# Patient Record
Sex: Female | Born: 1980 | Race: Black or African American | Hispanic: No | Marital: Single | Smoking: Former smoker
Health system: Southern US, Community
[De-identification: ages and names within clinical notes are randomized; demographics above are authoritative.]

## PROBLEM LIST (undated history)

## (undated) DIAGNOSIS — E119 Type 2 diabetes mellitus without complications: Secondary | ICD-10-CM

## (undated) DIAGNOSIS — R7303 Prediabetes: Secondary | ICD-10-CM

## (undated) DIAGNOSIS — T7840XA Allergy, unspecified, initial encounter: Secondary | ICD-10-CM

## (undated) DIAGNOSIS — R519 Headache, unspecified: Secondary | ICD-10-CM

## (undated) DIAGNOSIS — I10 Essential (primary) hypertension: Secondary | ICD-10-CM

## (undated) DIAGNOSIS — F419 Anxiety disorder, unspecified: Secondary | ICD-10-CM

## (undated) DIAGNOSIS — M1990 Unspecified osteoarthritis, unspecified site: Secondary | ICD-10-CM

## (undated) DIAGNOSIS — G8929 Other chronic pain: Secondary | ICD-10-CM

## (undated) DIAGNOSIS — F32A Depression, unspecified: Secondary | ICD-10-CM

## (undated) HISTORY — DX: Unspecified osteoarthritis, unspecified site: M19.90

## (undated) HISTORY — DX: Depression, unspecified: F32.A

## (undated) HISTORY — PX: OTHER SURGICAL HISTORY: SHX169

## (undated) HISTORY — DX: Prediabetes: R73.03

## (undated) HISTORY — DX: Other chronic pain: G89.29

## (undated) HISTORY — DX: Allergy, unspecified, initial encounter: T78.40XA

## (undated) HISTORY — PX: TUBAL LIGATION: SHX77

## (undated) HISTORY — DX: Anxiety disorder, unspecified: F41.9

---
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
## 2002-04-15 ENCOUNTER — Inpatient Hospital Stay (HOSPITAL_COMMUNITY): Admission: AD | Admit: 2002-04-15 | Discharge: 2002-04-17 | Payer: Self-pay | Admitting: Obstetrics and Gynecology

## 2003-07-30 ENCOUNTER — Emergency Department (HOSPITAL_COMMUNITY): Admission: EM | Admit: 2003-07-30 | Discharge: 2003-07-30 | Payer: Self-pay | Admitting: Family Medicine

## 2003-08-09 ENCOUNTER — Emergency Department (HOSPITAL_COMMUNITY): Admission: EM | Admit: 2003-08-09 | Discharge: 2003-08-09 | Payer: Self-pay

## 2004-12-13 ENCOUNTER — Ambulatory Visit (HOSPITAL_COMMUNITY): Admission: RE | Admit: 2004-12-13 | Discharge: 2004-12-13 | Payer: Self-pay | Admitting: Obstetrics & Gynecology

## 2005-12-27 ENCOUNTER — Emergency Department (HOSPITAL_COMMUNITY): Admission: EM | Admit: 2005-12-27 | Discharge: 2005-12-27 | Payer: Self-pay | Admitting: Emergency Medicine

## 2006-06-29 ENCOUNTER — Emergency Department (HOSPITAL_COMMUNITY): Admission: EM | Admit: 2006-06-29 | Discharge: 2006-06-29 | Payer: Self-pay | Admitting: Family Medicine

## 2006-07-01 ENCOUNTER — Emergency Department (HOSPITAL_COMMUNITY): Admission: EM | Admit: 2006-07-01 | Discharge: 2006-07-01 | Payer: Self-pay | Admitting: Emergency Medicine

## 2007-05-22 ENCOUNTER — Emergency Department (HOSPITAL_COMMUNITY): Admission: EM | Admit: 2007-05-22 | Discharge: 2007-05-22 | Payer: Self-pay | Admitting: Emergency Medicine

## 2007-11-14 ENCOUNTER — Emergency Department (HOSPITAL_COMMUNITY): Admission: EM | Admit: 2007-11-14 | Discharge: 2007-11-14 | Payer: Self-pay | Admitting: Emergency Medicine

## 2010-05-25 NOTE — Op Note (Signed)
NAME:  Nicole Deleon, REGISTER NO.:  [id number]   MEDICAL RECORD NO.:  [id number]          PATIENT TYPE:  AMB   LOCATION:  SDC                           FACILITY:  [facility]   PHYSICIAN:  Roseanna Rainbow, M.D.DATE OF BIRTH:  1980/02/18   DATE OF PROCEDURE:  12/13/2004  DATE OF DISCHARGE:                                 OPERATIVE REPORT   PREOPERATIVE DIAGNOSIS:  Multiparity, desires a sterilization procedure.   POSTOPERATIVE DIAGNOSIS:  Multiparity, desires a sterilization procedure.   PROCEDURE:  Laparoscopic bilateral tubal ligation with bipolar cautery.   SURGEON:  Roseanna Rainbow, M.D.   ANESTHESIA:  General endotracheal.   SPECIMENS:  None.   COMPLICATIONS:  None.   PROCEDURES:  The patient was taken to the operating room.  A general  anesthetic was administered without difficulty.  She was then placed in the  dorsal lithotomy position and prepped and draped in the usual sterile  fashion.  A bivalve speculum was placed in the patient's vagina.  The  anterior lip of the cervix was grasped with a single-tooth tenaculum.  A  Hulka manipulator was then advanced into the uterus and secured to the  anterior lip of the cervix as a means to manipulate the uterus.  The single-  tooth tenaculum and speculum were removed from the vagina.  Attention was  then turned to the abdomen, where a skin incision was made in the umbilical  fold.  While tenting up the anterior abdominal wall, the Veress needle was  then introduced into the abdomen at a 45 degree angle.  Intra-abdominal  placement was confirmed by a saline drop test and an appropriate low  pressure reading upon insufflation of CO2 gas.  The abdomen was the  insufflated with 4 L of CO2 gas.  The Veress needle was removed.  The trocar  and sleeve were then advanced into the abdomen, where intra-abdominal  placement was confirmed with the laparoscope.  A survey of the pelvic  viscera was normal.  The midisthmic  portion of the right fallopian tube was  then cauterized contiguously.  With each application, the Ohm meter was  noted to go to 0.  A 2 cm segment of tube was cauterized.  The left  fallopian tube was then manipulated in a similar fashion.  All the  instruments were removed from the abdomen.  The skin incision was  reapproximated with 3-0 Vicryl and Dermabond.  The Hulka manipulator was  removed from the vagina with minimal bleeding noted from the cervix.  At the  close of the procedure the instrument and pack counts were said to be  correct x2.  The patient was taken to the PACU awake and in stable  condition.      Roseanna Rainbow, M.D.  Electronically Signed    LAJ/MEDQ  D:  12/13/2004  T:  12/13/2004  Job:  [id number]

## 2010-10-09 LAB — COMPREHENSIVE METABOLIC PANEL
ALT: 15
AST: 18
Albumin: 3.6
Alkaline Phosphatase: 80
BUN: 7
CO2: 25
Calcium: 9.1
Chloride: 110
Creatinine, Ser: 0.67
GFR calc Af Amer: >60
GFR calc non Af Amer: >60
Glucose, Bld: 83
Potassium: 3.6
Sodium: 141
Total Bilirubin: 0.4
Total Protein: 6.8

## 2010-10-23 ENCOUNTER — Emergency Department (HOSPITAL_COMMUNITY)
Admission: EM | Admit: 2010-10-23 | Discharge: 2010-10-23 | Disposition: A | Discharge status: Home / Self Care | Payer: Self-pay | Attending: Emergency Medicine | Admitting: Emergency Medicine

## 2010-10-23 DIAGNOSIS — I10 Essential (primary) hypertension: Secondary | ICD-10-CM | POA: Insufficient documentation

## 2010-10-23 DIAGNOSIS — M79609 Pain in unspecified limb: Secondary | ICD-10-CM | POA: Insufficient documentation

## 2011-01-29 ENCOUNTER — Emergency Department (HOSPITAL_COMMUNITY)
Admission: EM | Admit: 2011-01-29 | Discharge: 2011-01-29 | Disposition: A | Discharge status: Home / Self Care | Payer: No Typology Code available for payment source | Attending: Emergency Medicine | Admitting: Emergency Medicine

## 2011-01-29 ENCOUNTER — Encounter (HOSPITAL_COMMUNITY): Payer: Self-pay | Admitting: *Deleted

## 2011-01-29 ENCOUNTER — Emergency Department (HOSPITAL_COMMUNITY): Payer: No Typology Code available for payment source

## 2011-01-29 DIAGNOSIS — M25561 Pain in right knee: Secondary | ICD-10-CM

## 2011-01-29 DIAGNOSIS — S239XXA Sprain of unspecified parts of thorax, initial encounter: Secondary | ICD-10-CM | POA: Insufficient documentation

## 2011-01-29 DIAGNOSIS — R269 Unspecified abnormalities of gait and mobility: Secondary | ICD-10-CM | POA: Insufficient documentation

## 2011-01-29 DIAGNOSIS — M546 Pain in thoracic spine: Secondary | ICD-10-CM | POA: Insufficient documentation

## 2011-01-29 DIAGNOSIS — M25569 Pain in unspecified knee: Secondary | ICD-10-CM | POA: Insufficient documentation

## 2011-01-29 DIAGNOSIS — IMO0002 Reserved for concepts with insufficient information to code with codable children: Secondary | ICD-10-CM

## 2011-01-29 MED ORDER — DIAZEPAM 5 MG PO TABS: 5.0000 mg | ORAL_TABLET | Freq: Three times a day (TID) | ORAL | Status: AC | PRN

## 2011-01-29 MED ORDER — IBUPROFEN 800 MG PO TABS
800.0000 mg | ORAL_TABLET | Freq: Once | ORAL | Status: AC
  Administered 2011-01-29: 800 mg via ORAL
  Filled 2011-01-29: qty 1

## 2011-01-29 MED ORDER — IBUPROFEN 600 MG PO TABS: 600.0000 mg | ORAL_TABLET | Freq: Four times a day (QID) | ORAL | Status: AC | PRN

## 2011-01-29 MED ORDER — DIAZEPAM 5 MG PO TABS
5.0000 mg | ORAL_TABLET | Freq: Once | ORAL | Status: AC
  Administered 2011-01-29: 5 mg via ORAL
  Filled 2011-01-29: qty 1

## 2011-01-29 NOTE — ED Notes (Signed)
Ortho paged & notified regarding knee sleeve
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
## 2011-01-29 NOTE — ED Provider Notes (Signed)
Medical screening examination/treatment/procedure(s) were performed by non-physician practitioner and as supervising physician I was immediately available for consultation/collaboration.   Gerhard Munch, MD 01/29/11 [phone]
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
## 2011-10-18 ENCOUNTER — Emergency Department (HOSPITAL_COMMUNITY)
Admission: EM | Admit: 2011-10-18 | Discharge: 2011-10-18 | Disposition: A | Discharge status: Home / Self Care | Payer: Self-pay | Attending: Emergency Medicine | Admitting: Emergency Medicine

## 2011-10-18 ENCOUNTER — Encounter (HOSPITAL_COMMUNITY): Payer: Self-pay | Admitting: *Deleted

## 2011-10-18 DIAGNOSIS — E119 Type 2 diabetes mellitus without complications: Secondary | ICD-10-CM | POA: Insufficient documentation

## 2011-10-18 DIAGNOSIS — F17200 Nicotine dependence, unspecified, uncomplicated: Secondary | ICD-10-CM | POA: Insufficient documentation

## 2011-10-18 DIAGNOSIS — I10 Essential (primary) hypertension: Secondary | ICD-10-CM | POA: Insufficient documentation

## 2011-10-18 DIAGNOSIS — M65849 Other synovitis and tenosynovitis, unspecified hand: Secondary | ICD-10-CM | POA: Insufficient documentation

## 2011-10-18 DIAGNOSIS — M65839 Other synovitis and tenosynovitis, unspecified forearm: Secondary | ICD-10-CM | POA: Insufficient documentation

## 2011-10-18 DIAGNOSIS — M659 Synovitis and tenosynovitis, unspecified: Secondary | ICD-10-CM

## 2011-10-18 HISTORY — DX: Type 2 diabetes mellitus without complications: E11.9

## 2011-10-18 HISTORY — DX: Essential (primary) hypertension: I10

## 2011-10-18 MED ORDER — NAPROXEN 500 MG PO TABS: 500.0000 mg | ORAL_TABLET | Freq: Two times a day (BID) | ORAL | Status: DC

## 2011-10-18 MED ORDER — IBUPROFEN 800 MG PO TABS: 800.0000 mg | ORAL_TABLET | Freq: Once | ORAL | Status: DC

## 2011-10-18 NOTE — ED Provider Notes (Signed)
History     CSN: [id number]  Arrival date & time 10/18/11  1615   First MD Initiated Contact with Patient 10/18/11 1642      Chief Complaint  Patient presents with  . Hand Injury    (Consider location/radiation/quality/duration/timing/severity/associated sxs/prior treatment) HPI Comments: Pt to ER with right hand pain. Onset > then 2 weeks ago. Pain located over radial styloid area. Pain severity mild to moderate, exacerbated by thumb movements, improved by nothing. Pt denies F/NS/Chills, numbness, tingling or weakness of extremity.    Past Medical History  Diagnosis Date  . Diabetes mellitus without complication   . Hypertension     Past Surgical History  Procedure Date  . No previous surgeries     No family history on file.  History  Substance Use Topics  . Smoking status: Current Every Day Smoker -- 0.5 packs/day for 12 years    Types: Cigarettes  . Smokeless tobacco: Not on file  . Alcohol Use: Yes    OB History    Grav Para Term Preterm Abortions TAB SAB Ect Mult Living                  Review of Systems  Constitutional: Negative for fever, diaphoresis and activity change.  HENT: Negative for congestion and neck pain.   Respiratory: Negative for cough.   Genitourinary: Negative for dysuria.  Musculoskeletal: Negative for myalgias.  Skin: Negative for color change and wound.  Neurological: Negative for headaches.  All other systems reviewed and are negative.    Allergies  Review of patient's allergies indicates no known allergies.  Home Medications   Current Outpatient Rx  Name Route Sig Dispense Refill  . NYQUIL PO Oral Take 30 mLs by mouth as needed. FOR COLD SYMPTOMS.      BP 126/83  Pulse 96  Temp 98.1 F (36.7 C) (Oral)  Resp 16  Ht 5\' 11"  (1.803 m)  Wt 280 lb (127.007 kg)  BMI 39.05 kg/m2  SpO2 100%  LMP 10/12/2011  Physical Exam  Nursing note and vitals reviewed. Constitutional: She appears well-developed and well-nourished.  No distress.  HENT:  Head: Normocephalic and atraumatic.  Eyes: Conjunctivae normal and EOM are normal.  Neck: Normal range of motion. Neck supple.  Cardiovascular:       Intact distal pulses, capillary refill < 3 seconds  Musculoskeletal:       Finkelstein test positive. TTP over radial styloid area. All other extremities with normal ROM  Neurological:       No sensory deficit  Skin: She is not diaphoretic.       Skin intact, no tenting    ED Course  Procedures (including critical care time)  Labs Reviewed - No data to display No results found.   No diagnosis found.    MDM   PE c/w Tenosynovitis, imaging not indicated at this time.  Pt advised to follow up with orthopedics (hand) if symptoms persist after conservative home therapy including Velcro splint, NSAIDs, and ICE.  Patient given brace while in ED, conservative therapy recommended and discussed. Patient will be dc home & is agreeable with above plan.           Jaci Carrel, [place] 10/18/11 1714

## 2011-10-18 NOTE — ED Notes (Signed)
Pt arrives ambulatory to [facility] with c/o pain to right hand. Pulses palpable, can move all digits. Pt sts that her hand just hurts when she does move it.

## 2011-10-18 NOTE — Progress Notes (Signed)
Pt confirms she is [place] a pcp.  Stating she has applied for medicaid but has not heard anything.  CM spoke with pt to review list of self pay pcps to further assist her with prescriptions and health care.  Discussed discounted pharmacies, DSS, health dept, needymeds.org and financial assistance programs in [employer]  Pt voiced understanding and appreciation of resources and services offered

## 2011-10-18 NOTE — ED Provider Notes (Signed)
Medical screening examination/treatment/procedure(s) were performed by non-physician practitioner and as supervising physician I was immediately available for consultation/collaboration.  Jones Skene, M.D.     Jones Skene, MD 10/18/11 1817

## 2012-01-05 ENCOUNTER — Emergency Department (HOSPITAL_COMMUNITY)
Admission: EM | Admit: 2012-01-05 | Discharge: 2012-01-05 | Disposition: A | Discharge status: Home / Self Care | Payer: Self-pay | Attending: Emergency Medicine | Admitting: Emergency Medicine

## 2012-01-05 ENCOUNTER — Encounter (HOSPITAL_COMMUNITY): Payer: Self-pay | Admitting: *Deleted

## 2012-01-05 DIAGNOSIS — R51 Headache: Secondary | ICD-10-CM | POA: Insufficient documentation

## 2012-01-05 DIAGNOSIS — R059 Cough, unspecified: Secondary | ICD-10-CM | POA: Insufficient documentation

## 2012-01-05 DIAGNOSIS — R05 Cough: Secondary | ICD-10-CM | POA: Insufficient documentation

## 2012-01-05 DIAGNOSIS — E119 Type 2 diabetes mellitus without complications: Secondary | ICD-10-CM | POA: Insufficient documentation

## 2012-01-05 DIAGNOSIS — F17200 Nicotine dependence, unspecified, uncomplicated: Secondary | ICD-10-CM | POA: Insufficient documentation

## 2012-01-05 DIAGNOSIS — I10 Essential (primary) hypertension: Secondary | ICD-10-CM | POA: Insufficient documentation

## 2012-01-05 DIAGNOSIS — H9209 Otalgia, unspecified ear: Secondary | ICD-10-CM | POA: Insufficient documentation

## 2012-01-05 DIAGNOSIS — J3489 Other specified disorders of nose and nasal sinuses: Secondary | ICD-10-CM | POA: Insufficient documentation

## 2012-01-05 DIAGNOSIS — J329 Chronic sinusitis, unspecified: Secondary | ICD-10-CM | POA: Insufficient documentation

## 2012-01-05 MED ORDER — DEXTROMETHORPHAN HBR 15 MG/5ML PO SYRP: 10.0000 mL | ORAL_SOLUTION | Freq: Four times a day (QID) | ORAL | Status: DC | PRN

## 2012-01-05 MED ORDER — AZITHROMYCIN 250 MG PO TABS: 250.0000 mg | ORAL_TABLET | Freq: Every day | ORAL | Status: DC

## 2012-01-05 NOTE — ED Notes (Signed)
Pt c/o fever/cough/weak/right ear pain/no appetite

## 2012-01-05 NOTE — ED Notes (Signed)
Pt ambulatory to exam room with steady gait. Pt states symptoms have been going on since Friday.

## 2012-01-05 NOTE — ED Provider Notes (Signed)
History     CSN: [id number]  Arrival date & time 01/05/12  2049   First MD Initiated Contact with Patient 01/05/12 2209      Chief Complaint  Patient presents with  . flu symptoms     (Consider location/radiation/quality/duration/timing/severity/associated sxs/prior treatment) HPI Comments: Patient is a 31 year old female who presents with a 3 day history of multiple complaints including fever, non productive cough, headache and right ear pain. Patient reports the pain in her head and ear is dull and aching. Patient's symptoms started gradually and progressively worsened since the onset. Patient has tried OTC remedies without relief. No aggravating/alleviating factors. Additional associated symptoms include anorexia.    Past Medical History  Diagnosis Date  . Diabetes mellitus without complication   . Hypertension     Past Surgical History  Procedure Date  . No previous surgeries     No family history on file.  History  Substance Use Topics  . Smoking status: Current Every Day Smoker -- 0.5 packs/day for 12 years    Types: Cigarettes  . Smokeless tobacco: Not on file  . Alcohol Use: Yes    OB History    Grav Para Term Preterm Abortions TAB SAB Ect Mult Living                  Review of Systems  Constitutional: Positive for fever.  HENT: Positive for congestion and sinus pressure.   Respiratory: Positive for cough.   All other systems reviewed and are negative.    Allergies  Review of patient's allergies indicates no known allergies.  Home Medications  No current outpatient prescriptions on file.  BP 126/80  Pulse 100  Temp 98.6 F (37 C)  Resp 20  SpO2 99%  LMP 12/29/2011  Physical Exam  Nursing note and vitals reviewed. Constitutional: She is oriented to person, place, and time. She appears well-developed and well-nourished. No distress.  HENT:  Head: Normocephalic and atraumatic.  Right Ear: External ear normal.  Left Ear: External ear  normal.  Nose: Nose normal.  Mouth/Throat: Oropharynx is clear and moist. No oropharyngeal exudate.       Frontal and maxillary sinus tenderness to palpation.   Eyes: Conjunctivae normal and EOM are normal. Pupils are equal, round, and reactive to light. No scleral icterus.  Neck: Normal range of motion. Neck supple.  Cardiovascular: Normal rate and regular rhythm.  Exam reveals no gallop and no friction rub.   No murmur heard. Pulmonary/Chest: Effort normal and breath sounds normal. She has no wheezes. She has no rales. She exhibits no tenderness.  Abdominal: Soft. She exhibits no distension. There is no tenderness.  Musculoskeletal: Normal range of motion.  Lymphadenopathy:    She has no cervical adenopathy.  Neurological: She is alert and oriented to person, place, and time. Coordination normal.       Speech is goal-oriented. Moves limbs without ataxia.   Skin: Skin is warm and dry. She is not diaphoretic.  Psychiatric: She has a normal mood and affect. Her behavior is normal.    ED Course  Procedures (including critical care time)  Labs Reviewed - No data to display No results found.   1. Sinusitis       MDM  10:20 PM Patient likely has sinusitis. I will treat her with a z-pack and dextromethorphan. No further evaluation needed at this time. Patient is afebril and stable for discharge.         Emilia Beck, PA-C 01/07/12 [phone]

## 2012-01-09 NOTE — ED Provider Notes (Signed)
Medical screening examination/treatment/procedure(s) were performed by non-physician practitioner and as supervising physician I was immediately available for consultation/collaboration.  Hutchinson Isenberg K Linker, MD 01/09/12 0708 

## 2012-07-27 ENCOUNTER — Emergency Department (HOSPITAL_COMMUNITY)
Admission: EM | Admit: 2012-07-27 | Discharge: 2012-07-27 | Disposition: A | Discharge status: Home / Self Care | Payer: Self-pay | Attending: Emergency Medicine | Admitting: Emergency Medicine

## 2012-07-27 ENCOUNTER — Encounter (HOSPITAL_COMMUNITY): Payer: Self-pay | Admitting: *Deleted

## 2012-07-27 DIAGNOSIS — E119 Type 2 diabetes mellitus without complications: Secondary | ICD-10-CM | POA: Insufficient documentation

## 2012-07-27 DIAGNOSIS — Y92838 Other recreation area as the place of occurrence of the external cause: Secondary | ICD-10-CM | POA: Insufficient documentation

## 2012-07-27 DIAGNOSIS — Y990 Civilian activity done for income or pay: Secondary | ICD-10-CM | POA: Insufficient documentation

## 2012-07-27 DIAGNOSIS — Y9367 Activity, basketball: Secondary | ICD-10-CM | POA: Insufficient documentation

## 2012-07-27 DIAGNOSIS — I10 Essential (primary) hypertension: Secondary | ICD-10-CM | POA: Insufficient documentation

## 2012-07-27 DIAGNOSIS — IMO0002 Reserved for concepts with insufficient information to code with codable children: Secondary | ICD-10-CM | POA: Insufficient documentation

## 2012-07-27 DIAGNOSIS — X500XXA Overexertion from strenuous movement or load, initial encounter: Secondary | ICD-10-CM | POA: Insufficient documentation

## 2012-07-27 DIAGNOSIS — F17200 Nicotine dependence, unspecified, uncomplicated: Secondary | ICD-10-CM | POA: Insufficient documentation

## 2012-07-27 DIAGNOSIS — Y9239 Other specified sports and athletic area as the place of occurrence of the external cause: Secondary | ICD-10-CM | POA: Insufficient documentation

## 2012-07-27 DIAGNOSIS — S8392XA Sprain of unspecified site of left knee, initial encounter: Secondary | ICD-10-CM

## 2012-07-27 DIAGNOSIS — W2209XA Striking against other stationary object, initial encounter: Secondary | ICD-10-CM | POA: Insufficient documentation

## 2012-07-27 MED ORDER — IBUPROFEN 800 MG PO TABS: 800.0000 mg | ORAL_TABLET | Freq: Three times a day (TID) | ORAL | Status: DC

## 2012-07-27 MED ORDER — HYDROCODONE-ACETAMINOPHEN 5-325 MG PO TABS: 1.0000 | ORAL_TABLET | ORAL | Status: DC | PRN

## 2012-07-27 NOTE — ED Notes (Signed)
Discharge instructions reviewed with pt. Pt verbalized understanding.   

## 2012-07-27 NOTE — ED Provider Notes (Signed)
   History    CSN: [id number] Arrival date & time 07/27/12  1938  None    Chief Complaint  Patient presents with  . Knee Injury   (Consider location/radiation/quality/duration/timing/severity/associated sxs/prior Treatment) HPI History provided by pt.   Pt c/o L knee pain x 9d.  Landed a jump wrong while playing basketball and it twisted.  Pain acutely worsened over the weekend when a coworker accidentally hit anterior knee w/ bucket of ice.  Aggravated by bearing weight and no improvement w/ icing/elevating. No associated fever, paresthesias, laxity.   Past Medical History  Diagnosis Date  . Diabetes mellitus without complication   . Hypertension    Past Surgical History  Procedure Laterality Date  . No previous surgeries     History reviewed. No pertinent family history. History  Substance Use Topics  . Smoking status: Current Every Day Smoker -- 0.50 packs/day for 12 years    Types: Cigarettes  . Smokeless tobacco: Not on file  . Alcohol Use: Yes   OB History   Grav Para Term Preterm Abortions TAB SAB Ect Mult Living                 Review of Systems  All other systems reviewed and are negative.    Allergies  Review of patient's allergies indicates no known allergies.  Home Medications   Current Outpatient Rx  Name  Route  Sig  Dispense  Refill  . HYDROcodone-acetaminophen (NORCO/VICODIN) 5-325 MG per tablet   Oral   Take 1 tablet by mouth every 4 (four) hours as needed for pain.   15 tablet   0   . ibuprofen (ADVIL,MOTRIN) 800 MG tablet   Oral   Take 1 tablet (800 mg total) by mouth 3 (three) times daily.   12 tablet   0    BP 126/80  Pulse 92  Temp(Src) 98.2 F (36.8 C) (Oral)  Resp 16  SpO2 99% Physical Exam  Nursing note and vitals reviewed. Constitutional: She is oriented to person, place, and time. She appears well-developed and well-nourished. No distress.  HENT:  Head: Normocephalic and atraumatic.  Eyes:  Normal appearance  Neck:  Normal range of motion.  Pulmonary/Chest: Effort normal.  Musculoskeletal: Normal range of motion.  L knee w/ mild edema compared to right.  Pt holding it in slight flexion.  No ecchymosis, erythema or warmth.  Diffuse tenderness anteriorly.  No edema/tenderness of calf.  Pain w/ passive ROM, particularly extension.  2+ DP pulse and distal sensation intact.    Neurological: She is alert and oriented to person, place, and time.  Psychiatric: She has a normal mood and affect. Her behavior is normal.    ED Course  Procedures (including critical care time) Labs Reviewed - No data to display No results found. 1. Sprain of left knee, initial encounter     MDM  32yo healthy F presents w/ R knee injury.  No signs of septic arthritis on exam.  Low clinical suspicion for fx based on mechanism of injury and patient's ability to bear weight.  NV intact.  Ortho tech provided w/ knee sleeve and crutches and pt d/c'd home w/ 15 vicodin, 800mg  ibuprofen and referral to ortho.  Recommended that she continue icing/elevating.   Otilio Miu, PA-C 07/27/12 2352

## 2012-07-27 NOTE — ED Notes (Signed)
Pt states she hurt her left knee after playing basketball. Pt states her knee was hit with a bucket of ice at work.

## 2012-07-27 NOTE — ED Notes (Signed)
Pt states that she playing basketball and she felt like she tweeked her left knee. Pt states that since the injury she has been having swelling and pain. Pt states that she cannot hold her left leg out straight when sitting down due to pain. Pt is ambulatory. Pt does have swelling noted below left knee

## 2012-07-28 NOTE — ED Provider Notes (Signed)
  Medical screening examination/treatment/procedure(s) were performed by non-physician practitioner and as supervising physician I was immediately available for consultation/collaboration.    Aerie Donica, MD 07/28/12 0051 

## 2012-08-05 ENCOUNTER — Ambulatory Visit: Payer: Self-pay

## 2013-03-02 ENCOUNTER — Encounter (HOSPITAL_COMMUNITY): Payer: Self-pay | Admitting: Emergency Medicine

## 2013-03-02 ENCOUNTER — Emergency Department (HOSPITAL_COMMUNITY)
Admission: EM | Admit: 2013-03-02 | Discharge: 2013-03-02 | Disposition: A | Discharge status: Home / Self Care | Payer: No Typology Code available for payment source | Source: Home / Self Care | Attending: Family Medicine | Admitting: Family Medicine

## 2013-03-02 DIAGNOSIS — K1120 Sialoadenitis, unspecified: Secondary | ICD-10-CM

## 2013-03-02 MED ORDER — CLINDAMYCIN HCL 300 MG PO CAPS: 300.0000 mg | ORAL_CAPSULE | Freq: Three times a day (TID) | ORAL | Status: DC

## 2013-03-02 NOTE — ED Provider Notes (Signed)
CSN: [id number]     Arrival date & time 03/02/13  1341 History   First MD Initiated Contact with Patient 03/02/13 1422     Chief Complaint  Patient presents with  . Facial Swelling     (Consider location/radiation/quality/duration/timing/severity/associated sxs/prior Treatment) HPI Comments: 33 year old female presents complaining of painful swelling under her left jaw for about one week. This has bothered her intermittently for the past couple of months but has been mild and infrequent. For the past week, the pain has been more severe and constant. She has not noticed any alleviating or exacerbating factors with the pain. She has no recent illness. She does not feel sick at this time. She denies any other symptoms. She has no dental issues.   Past Medical History  Diagnosis Date  . Diabetes mellitus without complication   . Hypertension    Past Surgical History  Procedure Laterality Date  . No previous surgeries     History reviewed. No pertinent family history. History  Substance Use Topics  . Smoking status: Current Every Day Smoker -- 0.50 packs/day for 12 years    Types: Cigarettes  . Smokeless tobacco: Not on file  . Alcohol Use: Yes   OB History   Grav Para Term Preterm Abortions TAB SAB Ect Mult Living                 Review of Systems  Constitutional: Negative for fever and chills.  HENT: Positive for facial swelling (see history of present illness). Negative for dental problem.   Eyes: Negative for visual disturbance.  Respiratory: Negative for cough and shortness of breath.   Cardiovascular: Negative for chest pain, palpitations and leg swelling.  Gastrointestinal: Negative for nausea, vomiting and abdominal pain.  Endocrine: Negative for polydipsia and polyuria.  Genitourinary: Negative for dysuria, urgency and frequency.  Musculoskeletal: Negative for arthralgias and myalgias.  Skin: Negative for rash.  Neurological: Negative for dizziness, weakness and  light-headedness.      Allergies  Review of patient's allergies indicates no known allergies.  Home Medications   Current Outpatient Rx  Name  Route  Sig  Dispense  Refill  . clindamycin (CLEOCIN) 300 MG capsule   Oral   Take 1 capsule (300 mg total) by mouth 3 (three) times daily.   30 capsule   0   . HYDROcodone-acetaminophen (NORCO/VICODIN) 5-325 MG per tablet   Oral   Take 1 tablet by mouth every 4 (four) hours as needed for pain.   15 tablet   0   . ibuprofen (ADVIL,MOTRIN) 800 MG tablet   Oral   Take 1 tablet (800 mg total) by mouth 3 (three) times daily.   12 tablet   0    BP 143/78  Pulse 79  Temp(Src) 98.7 F (37.1 C) (Oral)  Resp 16  SpO2 100%  LMP 01/07/2013 Physical Exam  Nursing note and vitals reviewed. Constitutional: She is oriented to person, place, and time. Vital signs are normal. She appears well-developed and well-nourished. No distress.  HENT:  Head: Normocephalic and atraumatic.    Nose: Nose normal. Right sinus exhibits no maxillary sinus tenderness and no frontal sinus tenderness. Left sinus exhibits no maxillary sinus tenderness and no frontal sinus tenderness.  Mouth/Throat: Uvula is midline, oropharynx is clear and moist and mucous membranes are normal. No dental abscesses or dental caries.  Pulmonary/Chest: Effort normal. No respiratory distress.  Neurological: She is alert and oriented to person, place, and time. She has normal strength. Coordination normal.  Skin: Skin is warm and dry. No rash noted. She is not diaphoretic.  Psychiatric: She has a normal mood and affect. Judgment normal.    ED Course  Procedures (including critical care time) Labs Review Labs Reviewed - No data to display Imaging Review No results found.    MDM   Final diagnoses:  Sialadenitis    Will treat with clindamycin, also advised to get sour candies and it to try to dislodge any salivary stone that may be present. This will treat both  sialadenitis & sialadenolithiasis        Liam Graham, PA-C 03/02/13 1446

## 2013-03-02 NOTE — ED Notes (Signed)
Pt  Reports  Some  Swelling  To  l  Side  Of  Face  Under  The   Jaw         Symptoms  For  About  1  Week      The  Area  Is  Tender  To  Palpation                 She  Says  Her  Throat  Has  Been  Scratchy

## 2013-03-02 NOTE — Discharge Instructions (Signed)
Sialadenitis Sialadenitis is an inflammation (soreness) of the salivary glands. The parotid is the main salivary gland. It lies behind the angle of the jaw below the ear. The saliva produced comes out of a tiny opening (duct) inside the cheek on either side. This is usually at the level of the upper back teeth. If it is swollen, the ear is pushed up and out. This helps tell this condition apart from a simple lymph gland infection (swollen glands) in the same area. Mumps has mostly disappeared since the start of immunization against mumps. Now the most common cause of parotitis is germ (bacterial) infection or inflammation of the lymphatics (the lymph channels). The other major salivary gland is located in the floor of the mouth. Smaller salivary glands are located in the mouth. This includes the:  Lips.  Lining of the mouth.  Pharynx.  Hard palate (front part of the roof of the mouth). The salivary glands do many things, including:  Lubrication.  Breaking down food.  Production of hormones and antibodies (to protect against germs which may cause illness).  Help with the sense of taste. ACUTE BACTERIAL SIALADENITIS This is a sudden inflammatory response to bacterial infection. This causes redness, pain, swelling and tenderness over the infected gland. In the past, it was common in dehydrated and debilitated patients often following an operation. It is now more commonly seen:  After radiotherapy.  In patients with poor immune systems. Treatment is:  The correction of fluid balance (rehydration).  Medicine that kill germs (antibiotics).  Pain relief. CHRONIC RECURRENT SIALADENITIS This refers to repeated episodes of discomfort and swelling of one of the salivary glands. It often occurs after eating. Chronic sialadenitis is usually less painful. It is associated with recurrent enlargement of a salivary gland, often following meals, and typically with an absence of redness. The chronic  form of the disease often is associated with conditions linked to decreased salivary flow, rather than dehydration (loss of body fluids). These conditions include:  A stone, or concretion, formed in the gallbladder, kidneys, or other parts of the body (calculi).  Salivary stasis.  A change in the fluid and electrolyte (the salts in your body fluids) makeup of the gland. It is treated with:  Gland massage.  Methods to stimulate the flow of saliva, (for example, lemon juice).  Antibiotics if required. Surgery to remove the gland is possible, but its benefits need to be balanced against risks.  VIRAL SIALADENITIS Several viruses infect the salivary glands. Some of these include the mumps virus that commonly infects the parotid gland. Other viruses causing problems are:  The HIV virus.  Herpes.  Some of the influenza ("flu") viruses. RECURRENT SIALADENITIS IN CHILDREN This condition is thought to be due to swelling or ballooning of the ducts. It results in the same symptoms as acute bacterial parotitis. It is usually caused by germs (bacteria). It is often treated using penicillin. It may get well without treatment. Surgery is usually not required. TUBERCULOUS SIALADENITIS The salivary glands may become infected with the same bacteria causing tuberculosis ("TB"). Treatment is with anti-tuberculous antibiotic therapy. OTHER UNCOMMON CAUSES OF SIALADENITIS   Sjogren's syndrome is a condition in which arthritis is associated with a decrease in activity of the glands of the body that produce saliva and tears. The diagnosis is made with blood tests or by examination of a piece of tissue from the inside of the lip. Some people with this condition are bothered by:  A dry mouth.  Intermittent salivary gland  enlargement.  Atypical mycobacteria is a germ similar to tuberculosis. It often infects children. It is often resistant to antibiotic treatment. It may require surgical treatment to remove  the infected salivary gland.  Actinomycosis is an infection of the parotid gland that may also involve the overlying skin. The diagnosis is made by detecting granules of sulphur produced by the bacteria on microscopic examination. Treatment is a prolonged course of penicillin for up to one year.  Nutritional causes include vitamin deficiencies and bulimia.  Diabetes and problems with your thyroid.  Obesity, cirrhosis, and malabsorption are some metabolic causes. HOME CARE INSTRUCTIONS   Apply ice bags every 2 hours for 15-20 minutes, while awake, to the sore gland for 24 hours, then as directed by your caregiver. Place the ice in a plastic bag with a towel around it to prevent frostbite to the skin.  Only take over-the-counter or prescription medicines for pain, discomfort, or fever as directed by your caregiver. SEEK IMMEDIATE MEDICAL CARE IF:   There is increased pain or swelling in your gland that is not controlled with medicine.  An oral temperature above 102 F (38.9 C) develops, not controlled by medicine.  You develop difficulty opening your mouth, swallowing, or speaking. Document Released: 06/15/2001 Document Revised: 03/18/2011 Document Reviewed: 08/10/2007 [facility] Patient Information 2014 [place]. Salivary Stone Your exam shows you have a stone in one of your saliva glands. These small stones form around a mucous plug in the ducts of the glands and cause the saliva in the gland to be blocked. This makes the gland swollen and painful, especially when you eat. If repeated episodes occur, the gland can become infected. Sometimes these stones can be seen on x-ray. Treatment includes stimulating the production of saliva to push the stone out. You should suck on a lemon or sour candies several times daily. Antibiotic medicine may be needed if the gland is infected. Increasing fluids, applying warm compresses to the swollen area 3-4 times daily, and massaging the gland from  back to front may encourage drainage and passage of the stone. Surgical treatment to remove the stone is sometimes necessary, so proper medical follow up is very important. Call your doctor for an appointment as recommended. Call right away if you have a high fever, severe headache, vomiting, uncontrolled pain, or other serious symptoms. Document Released: 02/01/2004 Document Revised: 03/18/2011 Document Reviewed: 12/24/2004 [facility] Patient Information 2014 [place].

## 2013-03-03 NOTE — ED Provider Notes (Signed)
Medical screening examination/treatment/procedure(s) were performed by a resident physician or non-physician practitioner and as the supervising physician I was immediately available for consultation/collaboration.  Lynne Leader, MD    Gregor Hams, MD 03/03/13 [phone]

## 2013-03-27 ENCOUNTER — Emergency Department (HOSPITAL_COMMUNITY): Payer: No Typology Code available for payment source

## 2013-03-27 ENCOUNTER — Emergency Department (HOSPITAL_COMMUNITY)
Admission: EM | Admit: 2013-03-27 | Discharge: 2013-03-27 | Disposition: A | Discharge status: Home / Self Care | Payer: No Typology Code available for payment source | Attending: Emergency Medicine | Admitting: Emergency Medicine

## 2013-03-27 ENCOUNTER — Encounter (HOSPITAL_COMMUNITY): Payer: Self-pay | Admitting: Emergency Medicine

## 2013-03-27 DIAGNOSIS — F17200 Nicotine dependence, unspecified, uncomplicated: Secondary | ICD-10-CM | POA: Insufficient documentation

## 2013-03-27 DIAGNOSIS — Y9389 Activity, other specified: Secondary | ICD-10-CM | POA: Insufficient documentation

## 2013-03-27 DIAGNOSIS — Y929 Unspecified place or not applicable: Secondary | ICD-10-CM | POA: Insufficient documentation

## 2013-03-27 DIAGNOSIS — Z792 Long term (current) use of antibiotics: Secondary | ICD-10-CM | POA: Insufficient documentation

## 2013-03-27 DIAGNOSIS — X500XXA Overexertion from strenuous movement or load, initial encounter: Secondary | ICD-10-CM | POA: Insufficient documentation

## 2013-03-27 DIAGNOSIS — Z791 Long term (current) use of non-steroidal anti-inflammatories (NSAID): Secondary | ICD-10-CM | POA: Insufficient documentation

## 2013-03-27 DIAGNOSIS — I10 Essential (primary) hypertension: Secondary | ICD-10-CM | POA: Insufficient documentation

## 2013-03-27 DIAGNOSIS — S99929A Unspecified injury of unspecified foot, initial encounter: Principal | ICD-10-CM

## 2013-03-27 DIAGNOSIS — S99919A Unspecified injury of unspecified ankle, initial encounter: Principal | ICD-10-CM

## 2013-03-27 DIAGNOSIS — M25562 Pain in left knee: Secondary | ICD-10-CM

## 2013-03-27 DIAGNOSIS — E119 Type 2 diabetes mellitus without complications: Secondary | ICD-10-CM | POA: Insufficient documentation

## 2013-03-27 DIAGNOSIS — S8990XA Unspecified injury of unspecified lower leg, initial encounter: Secondary | ICD-10-CM | POA: Insufficient documentation

## 2013-03-27 MED ORDER — TRAMADOL HCL 50 MG PO TABS: 50.0000 mg | ORAL_TABLET | Freq: Four times a day (QID) | ORAL | Status: DC | PRN

## 2013-03-27 NOTE — ED Notes (Signed)
Onset 7 months left knee pain, was given crutches and knee sleeve.  Pt stopped wearing knee sleeve.  Has continued to walk with limp.  Onset 2 days ago pt was working moving boxes and heard "pop" in left knee.  Pain and limping worsened.  No other s/s noted.  Last dose of IBU yesterday morning, very little relief in pain.

## 2013-03-27 NOTE — ED Notes (Signed)
Pt presents to department for evaluation of L knee pain. States she was moving boxes yesterday when she heard something "pop." states increased pain today. 6/10 pain with walking and movement. Pt is alert and oriented x4. NAD.

## 2013-03-27 NOTE — Discharge Instructions (Signed)
Knee Pain Knee pain can be a result of an injury or other medical conditions. Treatment will depend on the cause of your pain. HOME CARE  Only take medicine as told by your doctor.  Keep a healthy weight. Being overweight can make the knee hurt more.  Stretch before exercising or playing sports.  If there is constant knee pain, change the way you exercise. Ask your doctor for advice.  Make sure shoes fit well. Choose the right shoe for the sport or activity.  Protect your knees. Wear kneepads if needed.  Rest when you are tired. GET HELP RIGHT AWAY IF:   Your knee pain does not stop.  Your knee pain does not get better.  Your knee joint feels hot to the touch.  You have a fever. MAKE SURE YOU:   Understand these instructions.  Will watch this condition.  Will get help right away if you are not doing well or get worse. Document Released: 03/22/2008 Document Revised: 03/18/2011 Document Reviewed: 03/22/2008 [employer] Patient Information 2014 [employer].  
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
## 2013-03-28 NOTE — ED Provider Notes (Signed)
Medical screening examination/treatment/procedure(s) were performed by non-physician practitioner and as supervising physician I was immediately available for consultation/collaboration.  Richarda Blade, MD 03/28/13 [phone]

## 2013-06-09 ENCOUNTER — Encounter (HOSPITAL_COMMUNITY): Payer: Self-pay | Admitting: Emergency Medicine

## 2013-06-09 ENCOUNTER — Emergency Department (HOSPITAL_COMMUNITY)
Admission: EM | Admit: 2013-06-09 | Discharge: 2013-06-10 | Disposition: A | Discharge status: Home / Self Care | Payer: No Typology Code available for payment source | Attending: Emergency Medicine | Admitting: Emergency Medicine

## 2013-06-09 DIAGNOSIS — F17200 Nicotine dependence, unspecified, uncomplicated: Secondary | ICD-10-CM | POA: Insufficient documentation

## 2013-06-09 DIAGNOSIS — I10 Essential (primary) hypertension: Secondary | ICD-10-CM | POA: Insufficient documentation

## 2013-06-09 DIAGNOSIS — M25569 Pain in unspecified knee: Secondary | ICD-10-CM | POA: Insufficient documentation

## 2013-06-09 DIAGNOSIS — G8929 Other chronic pain: Secondary | ICD-10-CM | POA: Insufficient documentation

## 2013-06-09 DIAGNOSIS — E119 Type 2 diabetes mellitus without complications: Secondary | ICD-10-CM | POA: Insufficient documentation

## 2013-06-09 MED ORDER — HYDROCODONE-ACETAMINOPHEN 5-325 MG PO TABS
1.0000 | ORAL_TABLET | Freq: Once | ORAL | Status: AC
  Administered 2013-06-09: 1 via ORAL
  Filled 2013-06-09: qty 1

## 2013-06-09 MED ORDER — HYDROCODONE-ACETAMINOPHEN 5-325 MG PO TABS: ORAL_TABLET | ORAL | Status: DC

## 2013-06-09 NOTE — ED Provider Notes (Signed)
CSN: [id number]     Arrival date & time 06/09/13  2200 History   First MD Initiated Contact with Patient 06/09/13 2313    This chart was scribed for non-physician practitioner, Monico Blitz, PA, working with No att. providers found by Terressa Koyanagi, ED Scribe. This patient was seen in room TR07C/TR07C and the patient's care was started at 11:34 PM.  PCP: No PCP Per Patient  Chief Complaint  Patient presents with  . Knee Pain    HPI HPI Comments: Nicole Deleon is a 33 y.o. female, with a history of DM and HTN, who presents to the Emergency Department complaining of intermittent, chronic left knee pain with associated swelling. Pt reports the episode that brought her to the ED today began 1-2 months ago. Pt rates the pain a 8 out of 10. Pt reports taking tylenol at home without much relief. Pt reports that she went to PT for her left knee pain, however, stopped going because she cannot afford PT. Pt reports that she has a ride home.   Past Medical History  Diagnosis Date  . Diabetes mellitus without complication   . Hypertension    Past Surgical History  Procedure Laterality Date  . No previous surgeries     No family history on file. History  Substance Use Topics  . Smoking status: Current Every Day Smoker -- 0.50 packs/day for 12 years    Types: Cigarettes  . Smokeless tobacco: Not on file  . Alcohol Use: Yes   OB History   Grav Para Term Preterm Abortions TAB SAB Ect Mult Living                 Review of Systems  A complete 10 system review of systems was obtained and all systems are negative except as noted in the HPI and PMH.    Allergies  Review of patient's allergies indicates no known allergies.  Home Medications   Prior to Admission medications   Medication Sig Start Date End Date Taking? Authorizing Provider  HYDROcodone-acetaminophen (NORCO/VICODIN) 5-325 MG per tablet Take 1-2 tablets by mouth every 6 hours as needed for pain. 06/09/13   Amandamarie Feggins,  PA-C   Triage Vitals: BP 133/86  Pulse 96  Temp(Src) 98.5 F (36.9 C) (Oral)  Resp 16  Ht 5\' 11"  (1.803 m)  Wt 260 lb (117.935 kg)  BMI 36.28 kg/m2  SpO2 98%  LMP 06/01/2013 Physical Exam  Nursing note and vitals reviewed. Constitutional: She is oriented to person, place, and time. She appears well-developed and well-nourished. No distress.  HENT:  Head: Normocephalic and atraumatic.  Eyes: Conjunctivae and EOM are normal.  Neck: Normal range of motion. No tracheal deviation present.  Cardiovascular: Normal rate.   Pulmonary/Chest: Effort normal. No respiratory distress.  Musculoskeletal: Normal range of motion.  Left Knee: No deformity, erythema or abrasions. FROM. Mild effusion or crepitance. Anterior and posterior drawer show no abnormal laxity. Stable to valgus and varus stress. Joint lines are non-tender. Neurovascularly intact. Pt ambulates with non-antalgic gait.    Neurological: She is alert and oriented to person, place, and time.  Skin: Skin is warm and dry.  Psychiatric: She has a normal mood and affect. Her behavior is normal.    ED Course  Procedures (including critical care time) DIAGNOSTIC STUDIES: Oxygen Saturation is 98% on RA, normal by my interpretation.    COORDINATION OF CARE: 11:39 PM-Discussed treatment plan which includes PCP referral, ortho referral, crutches, info regarding wellness center, meds, with pt  at bedside and pt agreed to plan.   Labs Review Labs Reviewed - No data to display  Imaging Review No results found.   EKG Interpretation None      MDM   Final diagnoses:  Chronic knee pain    Filed Vitals:   06/09/13 2218  BP: 133/86  Pulse: 96  Temp: 98.5 F (36.9 C)  TempSrc: Oral  Resp: 16  Height: 5\' 11"  (1.803 m)  Weight: 260 lb (117.935 kg)  SpO2: 98%    Medications  HYDROcodone-acetaminophen (NORCO/VICODIN) 5-325 MG per tablet 1 tablet (1 tablet Oral Given 06/09/13 2350)    Nicole Deleon is a 33 y.o. female  presenting with exacerbation of chronic knee pain. Advised patient to follow closely with orthopedist. Patient will be given crutches and pain control.  Evaluation does not show pathology that would require ongoing emergent intervention or inpatient treatment. Pt is hemodynamically stable and mentating appropriately. Discussed findings and plan with patient/guardian, who agrees with care plan. All questions answered. Return precautions discussed and outpatient follow up given.   Discharge Medication List as of 06/09/2013 11:45 PM    START taking these medications   Details  HYDROcodone-acetaminophen (NORCO/VICODIN) 5-325 MG per tablet Take 1-2 tablets by mouth every 6 hours as needed for pain., Print         I personally performed the services described in this documentation, which was scribed in my presence. The recorded information has been reviewed and is accurate.    Monico Blitz, PA-C 06/10/13 [phone]

## 2013-06-09 NOTE — ED Notes (Addendum)
C/o L knee pain, has been seen for L knee before, referred to PT, can no longer afford PT and has stopped, was told that "there is fluid that needs to be drained", pinpoints pain to L latera knee, swelling noted to L medial knee, "i know I have aggravated it by working long hours on my feet". Took tylenol 650 at 1800. No redness noted.

## 2013-06-09 NOTE — Discharge Instructions (Signed)
Take vicodin for breakthrough pain, do not drink alcohol, drive, care for children or do other critical tasks while taking vicodin.  Do not hesitate to return to the Emergency Department for any new, worsening or concerning symptoms.   If you do not have a primary care doctor you can establish one at the   [facility]: [place] [id number] [phone]  After you establish care. Let them know you were seen in the emergency room. They must obtain records for further management.    Knee Pain Knee pain can be a result of an injury or other medical conditions. Treatment will depend on the cause of your pain. HOME CARE  Only take medicine as told by your doctor.  Keep a healthy weight. Being overweight can make the knee hurt more.  Stretch before exercising or playing sports.  If there is constant knee pain, change the way you exercise. Ask your doctor for advice.  Make sure shoes fit well. Choose the right shoe for the sport or activity.  Protect your knees. Wear kneepads if needed.  Rest when you are tired. GET HELP RIGHT AWAY IF:   Your knee pain does not stop.  Your knee pain does not get better.  Your knee joint feels hot to the touch.  You have a fever. MAKE SURE YOU:   Understand these instructions.  Will watch this condition.  Will get help right away if you are not doing well or get worse. Document Released: 03/22/2008 Document Revised: 03/18/2011 Document Reviewed: 03/22/2008 [facility] Patient Information 2014 [place], [place].

## 2013-06-09 NOTE — ED Notes (Signed)
Patient presents stating she was seen here about 1 month ago for the pain to her left knee.  Stated that she was told by the therapist that she has fluid in her knee and it may have to be drained.  States the pain is constant but when she walks on it the pain increases.

## 2013-06-10 NOTE — ED Provider Notes (Signed)
Medical screening examination/treatment/procedure(s) were performed by non-physician practitioner and as supervising physician I was immediately available for consultation/collaboration.   EKG Interpretation None        Blanchard Kelch, MD 06/10/13 1055

## 2019-04-27 ENCOUNTER — Encounter (HOSPITAL_COMMUNITY): Payer: Self-pay

## 2019-04-27 ENCOUNTER — Other Ambulatory Visit: Payer: Self-pay

## 2019-04-27 ENCOUNTER — Ambulatory Visit (HOSPITAL_COMMUNITY)
Admission: EM | Admit: 2019-04-27 | Discharge: 2019-04-27 | Disposition: A | Discharge status: Home / Self Care | Payer: Self-pay | Attending: Urgent Care | Admitting: Urgent Care

## 2019-04-27 DIAGNOSIS — M25562 Pain in left knee: Secondary | ICD-10-CM

## 2019-04-27 DIAGNOSIS — M25362 Other instability, left knee: Secondary | ICD-10-CM

## 2019-04-27 MED ORDER — MELOXICAM 7.5 MG PO TABS: 7.5000 mg | ORAL_TABLET | Freq: Every day | ORAL | 0 refills | Status: DC

## 2019-04-27 NOTE — ED Provider Notes (Signed)
[place]   MRN: [id number] DOB: 1980/09/08  Subjective:   Nicole Deleon is a 39 y.o. female presenting for 1 week history of intermittent knee buckling, weakness and pain.  Last episode was 4 days ago and happened to slip while at work. No trauma, edema, bruising otherwise. Has used ibuprofen occasionally.   Denies taking chronic medications.  No Known Allergies  Past Medical History:  Diagnosis Date  . Diabetes mellitus without complication ([place])   . Hypertension      Past Surgical History:  Procedure Laterality Date  . no previous surgeries      Family History  Problem Relation Age of Onset  . Hypertension Mother   . Hypertension Father   . Hyperlipidemia Father     Social History   Tobacco Use  . Smoking status: Current Every Day Smoker    Packs/day: 0.50    Years: 12.00    Pack years: 6.00    Types: Cigarettes  . Smokeless tobacco: Never Used  Substance Use Topics  . Alcohol use: Yes  . Drug use: No    ROS   Objective:   Vitals: BP (!) 133/103 (BP Location: Right Arm)   Pulse 90   Temp 98.3 F (36.8 C) (Oral)   Resp 19   LMP 03/22/2019   SpO2 100%   Physical Exam Constitutional:      General: She is not in acute distress.    Appearance: Normal appearance. She is well-developed. She is obese. She is not ill-appearing, toxic-appearing or diaphoretic.  HENT:     Head: Normocephalic and atraumatic.     Nose: Nose normal.     Mouth/Throat:     Mouth: Mucous membranes are moist.     Pharynx: Oropharynx is clear.  Eyes:     General: No scleral icterus.    Extraocular Movements: Extraocular movements intact.     Pupils: Pupils are equal, round, and reactive to light.  Cardiovascular:     Rate and Rhythm: Normal rate.  Pulmonary:     Effort: Pulmonary effort is normal.  Musculoskeletal:     Left knee: No swelling, deformity, effusion, erythema, ecchymosis, lacerations, bony tenderness or crepitus. Normal range of motion.  Tenderness (overlying area outlined) present. Normal alignment, normal meniscus and normal patellar mobility.       Legs:  Skin:    General: Skin is warm and dry.  Neurological:     General: No focal deficit present.     Mental Status: She is alert and oriented to person, place, and time.  Psychiatric:        Mood and Affect: Mood normal.        Behavior: Behavior normal.        Thought Content: Thought content normal.        Judgment: Judgment normal.    Left knee wrapped using 6 inch Ace wrap.  Assessment and Plan :   PDMP not reviewed this encounter.  1. Acute pain of left knee   2. Knee buckling, left     Recommended conservative management with rest, Ace wrap, meloxicam.  Counseled patient that x-rays would not be helpful since she has not had a trauma or fall.  Recommended she contact an orthopedic practice for ultrasound or MRI to rule out definitively meniscus or ligament injury.  Patient will consider this. Counseled patient on potential for adverse effects with medications prescribed/recommended today, ER and return-to-clinic precautions discussed, patient verbalized understanding.    Jaynee Eagles, PA-C 04/27/19  1105  

## 2019-04-27 NOTE — ED Triage Notes (Addendum)
Pt is here with left knee pain that started Friday, she slipped on a wet floor at work. Pt has taken Advil to relieve discomfort. Pt last menstrual was between 03/22/2019-03/27/2019 & states she hasnt gotten one for April yet.

## 2019-12-07 ENCOUNTER — Other Ambulatory Visit: Payer: Self-pay

## 2019-12-07 ENCOUNTER — Emergency Department (HOSPITAL_COMMUNITY)
Admission: EM | Admit: 2019-12-07 | Discharge: 2019-12-08 | Disposition: A | Discharge status: Home / Self Care | Payer: Self-pay | Attending: Emergency Medicine | Admitting: Emergency Medicine

## 2019-12-07 ENCOUNTER — Encounter (HOSPITAL_COMMUNITY): Payer: Self-pay | Admitting: Emergency Medicine

## 2019-12-07 DIAGNOSIS — Z5321 Procedure and treatment not carried out due to patient leaving prior to being seen by health care provider: Secondary | ICD-10-CM | POA: Insufficient documentation

## 2019-12-07 DIAGNOSIS — M25511 Pain in right shoulder: Secondary | ICD-10-CM | POA: Insufficient documentation

## 2019-12-07 DIAGNOSIS — M546 Pain in thoracic spine: Secondary | ICD-10-CM | POA: Insufficient documentation

## 2019-12-07 NOTE — ED Triage Notes (Signed)
Pt c/o right upper back pain and right shoulder pain for 2 months   No known injury

## 2019-12-08 ENCOUNTER — Other Ambulatory Visit: Payer: Self-pay

## 2019-12-08 ENCOUNTER — Ambulatory Visit
Admission: EM | Admit: 2019-12-08 | Discharge: 2019-12-08 | Disposition: A | Discharge status: Home / Self Care | Payer: Self-pay | Attending: Emergency Medicine | Admitting: Emergency Medicine

## 2019-12-08 DIAGNOSIS — M546 Pain in thoracic spine: Secondary | ICD-10-CM

## 2019-12-08 LAB — POCT URINALYSIS DIP (MANUAL ENTRY)
Bilirubin, UA: NEGATIVE
Glucose, UA: NEGATIVE mg/dL
Ketones, POC UA: NEGATIVE mg/dL
Nitrite, UA: NEGATIVE
Protein Ur, POC: NEGATIVE mg/dL
Spec Grav, UA: >=1.03 — AB (ref 1.010–1.025)
Urobilinogen, UA: 0.2 E.U./dL
pH, UA: 5.5 (ref 5.0–8.0)

## 2019-12-08 MED ORDER — PREDNISONE 10 MG PO TABS: ORAL_TABLET | ORAL | 0 refills | Status: DC

## 2019-12-08 MED ORDER — TIZANIDINE HCL 4 MG PO TABS: 4.0000 mg | ORAL_TABLET | Freq: Four times a day (QID) | ORAL | 0 refills | Status: DC | PRN

## 2019-12-08 MED ORDER — KETOROLAC TROMETHAMINE 30 MG/ML IJ SOLN: 30.0000 mg | Freq: Once | INTRAMUSCULAR | Status: DC

## 2019-12-08 NOTE — ED Notes (Signed)
Pt called for VS, no response.  °

## 2019-12-08 NOTE — ED Provider Notes (Signed)
[facility]    CSN: [id number] Arrival date & time: 12/08/19  1414      History   Chief Complaint Chief Complaint  Patient presents with  . Back Pain    right side x 1 month  . Shoulder Pain    x 1 week    HPI Nicole Deleon is a 39 y.o. female history of hypertension, diabetes, presenting today for evaluation of back and shoulder pain.  Reports right-sided back pain that is progressively worsened over the past month.  Has spread to her right shoulder.  Denies any specific injury or trauma.  Worsens with certain movements.  Does report physical job and is on her feet a lot.  Denies history of similar.  Denies numbness or tingling, but reports last night had increased difficulty moving shoulder.  Using NSAIDs without relief.  HPI  Past Medical History:  Diagnosis Date  . Diabetes mellitus without complication ([place])   . Hypertension     There are no problems to display for this patient.   Past Surgical History:  Procedure Laterality Date  . no previous surgeries      OB History   No obstetric history on file.      Home Medications    Prior to Admission medications   Medication Sig Start Date End Date Taking? Authorizing Provider  predniSONE (DELTASONE) 10 MG tablet Begin with 6 tabs on day 1, 5 tab on day 2, 4 tab on day 3, 3 tab on day 4, 2 tab on day 5, 1 tab on day 6-take with food 12/08/19   Mandeep Ferch C, PA-C  tiZANidine (ZANAFLEX) 4 MG tablet Take 1 tablet (4 mg total) by mouth every 6 (six) hours as needed for muscle spasms. 12/08/19   Brynlynn Walko, Elesa Hacker, PA-C    Family History Family History  Problem Relation Age of Onset  . Hypertension Mother   . Hypertension Father   . Hyperlipidemia Father     Social History Social History   Tobacco Use  . Smoking status: Current Every Day Smoker    Packs/day: 0.50    Years: 12.00    Pack years: 6.00    Types: Cigarettes  . Smokeless tobacco: Never Used  Vaping Use  . Vaping Use: Never  used  Substance Use Topics  . Alcohol use: Yes  . Drug use: No     Allergies   Patient has no known allergies.   Review of Systems Review of Systems  Constitutional: Negative for fatigue and fever.  Eyes: Negative for visual disturbance.  Respiratory: Negative for shortness of breath.   Cardiovascular: Negative for chest pain.  Gastrointestinal: Negative for abdominal pain, nausea and vomiting.  Musculoskeletal: Positive for back pain and myalgias. Negative for arthralgias and joint swelling.  Skin: Negative for color change, rash and wound.  Neurological: Negative for dizziness, weakness, light-headedness and headaches.     Physical Exam Triage Vital Signs ED Triage Vitals  Enc Vitals Group     BP 12/08/19 1513 (!) 130/92     Pulse Rate 12/08/19 1513 86     Resp 12/08/19 1513 18     Temp 12/08/19 1513 98 F (36.7 C)     Temp Source 12/08/19 1513 Oral     SpO2 12/08/19 1513 98 %     Weight --      Height --      Head Circumference --      Peak Flow --  Pain Score 12/08/19 1514 8     Pain Loc --      Pain Edu? --      Excl. in [place]? --    No data found.  Updated Vital Signs BP (!) 130/92 (BP Location: Left Arm)   Pulse 86   Temp 98 F (36.7 C) (Oral)   Resp 18   LMP 11/23/2019 (Exact Date)   SpO2 98%   Visual Acuity Right Eye Distance:   Left Eye Distance:   Bilateral Distance:    Right Eye Near:   Left Eye Near:    Bilateral Near:     Physical Exam Vitals and nursing note reviewed.  Constitutional:      Appearance: She is well-developed.     Comments: No acute distress  HENT:     Head: Normocephalic and atraumatic.     Nose: Nose normal.  Eyes:     Conjunctiva/sclera: Conjunctivae normal.  Cardiovascular:     Rate and Rhythm: Normal rate.  Pulmonary:     Effort: Pulmonary effort is normal. No respiratory distress.  Abdominal:     General: There is no distension.  Musculoskeletal:        General: Normal range of motion.     Cervical  back: Neck supple.     Comments: Back: Diffusely tender throughout spine midline and thoracic area and lumbar area, no focal tenderness, no palpable abnormality or step-off, increased tenderness throughout right thoracic musculature and trapezius area  Right shoulder: Nontender to palpation along clavicle AC joint or scapular spine, full active range of motion of shoulder, strength 5/5 and equal bilaterally shoulder and grip strength.  Skin:    General: Skin is warm and dry.  Neurological:     Mental Status: She is alert and oriented to person, place, and time.      UC Treatments / Results  Labs (all labs ordered are listed, but only abnormal results are displayed) Labs Reviewed  POCT URINALYSIS DIP (MANUAL ENTRY) - Abnormal; Notable for the following components:      Result Value   Spec Grav, UA >=1.030 (*)    Blood, UA trace-intact (*)    Leukocytes, UA Trace (*)    All other components within normal limits    EKG   Radiology No results found.  Procedures Procedures (including critical care time)  Medications Ordered in UC Medications  ketorolac (TORADOL) 30 MG/ML injection 30 mg (has no administration in time range)    Initial Impression / Assessment and Plan / UC Course  I have reviewed the triage vital signs and the nursing notes.  Pertinent labs & imaging results that were available during my care of the patient were reviewed by me and considered in my medical decision making (see chart for details).     Suspect muscular straining, no mechanism of injury, low suspicion of acute bony abnormality.  Using NSAIDs without relief, will try course of prednisone, Toradol prior to discharge and muscle relaxers.  Gentle stretching and activity modification.  Discussed strict return precautions. Patient verbalized understanding and is agreeable with plan.  Final Clinical Impressions(s) / UC Diagnoses   Final diagnoses:  Acute right-sided thoracic back pain      Discharge Instructions     We gave you an injection of Toradol for your back Begin prednisone taper over the next 6 days-begin with 6 tablets, decrease by 1 tablet each day until complete-6, 5, 4, 3, 2, 1-take with food and in the morning if you are  able May supplement with tizanidine which is a muscle relaxer, may cause drowsiness, do not drive or work after taking Alternate ice and heat Gentle stretching Follow-up if not improving or worsening    ED Prescriptions    Medication Sig Dispense Auth. Provider   predniSONE (DELTASONE) 10 MG tablet Begin with 6 tabs on day 1, 5 tab on day 2, 4 tab on day 3, 3 tab on day 4, 2 tab on day 5, 1 tab on day 6-take with food 21 tablet Sherel Fennell C, PA-C   tiZANidine (ZANAFLEX) 4 MG tablet Take 1 tablet (4 mg total) by mouth every 6 (six) hours as needed for muscle spasms. 30 tablet Jada Kuhnert, [place] C, PA-C     PDMP not reviewed this encounter.   Janith Lima, PA-C 12/08/19 1550

## 2019-12-08 NOTE — ED Triage Notes (Signed)
Pt complains of right lower back/flank pain x 1 month with no known injury. Pt also states she is now having shoulder pain in her right shoulder. Pt is aox4 and ambulatory.

## 2019-12-08 NOTE — Discharge Instructions (Signed)
We gave you an injection of Toradol for your back Begin prednisone taper over the next 6 days-begin with 6 tablets, decrease by 1 tablet each day until complete-6, 5, 4, 3, 2, 1-take with food and in the morning if you are able May supplement with tizanidine which is a muscle relaxer, may cause drowsiness, do not drive or work after taking Alternate ice and heat Gentle stretching Follow-up if not improving or worsening

## 2020-03-07 HISTORY — PX: NO PAST SURGERIES: SHX2092

## 2020-04-04 ENCOUNTER — Other Ambulatory Visit: Payer: Self-pay

## 2020-04-04 ENCOUNTER — Encounter: Payer: Self-pay | Admitting: Medical

## 2020-04-04 ENCOUNTER — Ambulatory Visit: Payer: No Typology Code available for payment source | Admitting: Medical

## 2020-04-04 VITALS — BP 144/94 | HR 91 | Ht 71.0 in | Wt 284.6 lb

## 2020-04-04 DIAGNOSIS — R4589 Other symptoms and signs involving emotional state: Secondary | ICD-10-CM

## 2020-04-04 DIAGNOSIS — R079 Chest pain, unspecified: Secondary | ICD-10-CM | POA: Diagnosis not present

## 2020-04-04 DIAGNOSIS — Z720 Tobacco use: Secondary | ICD-10-CM | POA: Diagnosis not present

## 2020-04-04 DIAGNOSIS — I10 Essential (primary) hypertension: Secondary | ICD-10-CM | POA: Diagnosis not present

## 2020-04-04 DIAGNOSIS — D1720 Benign lipomatous neoplasm of skin and subcutaneous tissue of unspecified limb: Secondary | ICD-10-CM

## 2020-04-04 DIAGNOSIS — M1712 Unilateral primary osteoarthritis, left knee: Secondary | ICD-10-CM

## 2020-04-04 MED ORDER — BUPROPION HCL ER (XL) 150 MG PO TB24: 150.0000 mg | ORAL_TABLET | Freq: Every day | ORAL | 1 refills | Status: DC

## 2020-04-04 MED ORDER — HYDROCHLOROTHIAZIDE 25 MG PO TABS: 25.0000 mg | ORAL_TABLET | Freq: Every day | ORAL | 2 refills | Status: DC

## 2020-04-04 NOTE — Progress Notes (Signed)
Subjective: Chief Complaint  Patient presents with  . New Patient (Initial Visit)  . Knee Pain    Mostly left knee pain and some right knee pain. Had MRI 2 years ago   New patient today.   Was going to PCP in [place] prior.  Been here in [place] 2 years.   Moved back here to be with and help out family who lives here.  She notes ongoing knee pains since 2015.   Had MRI 2018 in [place], told she has bone on bone findings, OA.  Was doing PT at the time including aurotherapy, gel injection, cortisone shots.  Was advised she should use a cane or potentially have TKR.  HTN - has been on medication prior.   Last on medication 3 years ago.  Was on Hydrochlorothiazide. Does get some random sharp pains in left chest, brief.  Sometimes gets blurry vision.  Occasionally gets SOB.  These episode have only happened 2- 3 times in the last 2 years.    Wears corrective lenses.   She notes hx/o depression.  Life overall is stressful.   Father passed in 2019.  Does have some mood swings at time.  No prior medication for mental health issues.  She has daughter that has hx/o bipolar disorder.     Past Medical History:  Diagnosis Date  . Allergy   . Anxiety   . Arthritis    left knee  . Chronic back pain   . Depression   . Hypertension   . Prediabetes    No current outpatient medications on file prior to visit.   No current facility-administered medications on file prior to visit.   ROS as in subjective   Objective: BP (!) 144/94   Pulse 91   Ht 5\' 11"  (1.803 m)   Wt 284 lb 9.6 oz (129.1 kg)   SpO2 97%   BMI 39.69 kg/m   General appearence: alert, no distress, WD/WN, African American female Neck: supple, no lymphadenopathy, no thyromegaly, no masses, no bruits Heart: RRR, normal S1, S2, no murmurs Lungs: CTA bilaterally, no wheezes, rhonchi, or rales Pulses: 2+ symmetric, upper and lower extremities, normal cap refill Ext: no edema Left knee tender over anterior joint line  and patella, mild pain with passive flexion and extension, no obvious swelling.  otherwise left hip and ankle unremarkable.   Left lower legs, proximal 1/3rd anteromedially of both legs, with soft tissue fatty lump suggestive of lipoma   EKG Indication chest discomfort, HTN, rate 72 bpm, pr 137ms, QRS 71ms, QTC 413, axis 90 degrees, nsr, right ward axis   Assessment: Encounter Diagnoses  Name Primary?  . Osteoarthritis of left knee, unspecified osteoarthritis type Yes  . Depressed mood   . Chest pain, unspecified type   . Tobacco use   . Essential hypertension, benign   . Lipoma of lower extremity, unspecified laterality      Plan: OA left knee - hx/o severe OA. We will request prior MRI.   Referral to orthopedics  Chest pain - EKG reviewed  Depressed mood - discussed her concerns.  Referral to counseling  HTN - EKG reviewed, restart HCTZ.    Tobacco use - advised cessation.  Begin trial of Wellbutrin  discussed risks/benefits and proper use of medication  Lipoma of legs - reassured   Blia was seen today for new patient (initial visit) and knee pain.  Diagnoses and all orders for this visit:  Osteoarthritis of left knee, unspecified osteoarthritis type -  Ambulatory referral to Orthopedic Surgery  Depressed mood  Chest pain, unspecified type -     EKG 12-Lead  Tobacco use  Essential hypertension, benign -     EKG 12-Lead  Lipoma of lower extremity, unspecified laterality  Other orders -     buPROPion (WELLBUTRIN XL) 150 MG 24 hr tablet; Take 1 tablet (150 mg total) by mouth daily. -     hydrochlorothiazide (HYDRODIURIL) 25 MG tablet; Take 1 tablet (25 mg total) by mouth daily.   F/u soon for fasting physical

## 2020-04-12 ENCOUNTER — Ambulatory Visit (INDEPENDENT_AMBULATORY_CARE_PROVIDER_SITE_OTHER): Payer: No Typology Code available for payment source

## 2020-04-12 ENCOUNTER — Other Ambulatory Visit: Payer: Self-pay

## 2020-04-12 ENCOUNTER — Ambulatory Visit (INDEPENDENT_AMBULATORY_CARE_PROVIDER_SITE_OTHER): Payer: No Typology Code available for payment source | Admitting: Orthopedic Surgery

## 2020-04-12 DIAGNOSIS — M25562 Pain in left knee: Secondary | ICD-10-CM

## 2020-04-16 ENCOUNTER — Encounter: Payer: Self-pay | Admitting: Orthopedic Surgery

## 2020-04-16 NOTE — Progress Notes (Signed)
Office Visit Note   Patient: Nicole Deleon           Date of Birth: 06-Jul-1980           MRN: [id number] Visit Date: 04/12/2020 Requested by: Carlena Hurl, PA-C [address] [place],  [place] [address] PCP: Carlena Hurl, PA-C  Subjective: Chief Complaint  Patient presents with  . Left Knee - Pain    HPI: Nicole Deleon is a 40 year old patient with left knee pain.  She states "I not have cartilage in my knee".  Pain has been going on for 6 years.  Denies any history of injury.  Previous treatment for knee pain occurred when she lived in [place].  Takes Tylenol and ibuprofen.  Saw an orthopedic surgeon in 2019.  Cortisone, gel, aqua therapy and normal physical therapy was performed.  She does use a knee brace.  She does fast food work.  States that her knee gives out 5-6 times a day.  We do not have access to any of that imaging.              ROS: All systems reviewed are negative as they relate to the chief complaint within the history of present illness.  Patient denies  fevers or chills.   Assessment & Plan: Visit Diagnoses:  1. Left knee pain, unspecified chronicity     Plan: Impression is left knee pain with what appears to be primarily patellofemoral osteoarthritis.  Hard to say if there is any type of arthroscopic intervention which would be predictably helpful for the knee.  Her knee does not have too much instability on exam in terms of patellofemoral instability.  Plan MRI of the left knee to evaluate whether or not she has isolated patellofemoral arthritis.  She may be a candidate for patellofemoral arthroplasty depending on the status of her other compartments.  We will see her back after that study.  Follow-Up Instructions: Return for after MRI.   Orders:  Orders Placed This Encounter  Procedures  . XR KNEE 3 VIEW LEFT  . MR Knee Left w/o contrast   No orders of the defined types were placed in this encounter.     Procedures: No procedures  performed   Clinical Data: No additional findings.  Objective: Vital Signs: There were no vitals taken for this visit.  Physical Exam:   Constitutional: Patient appears well-developed HEENT:  Head: Normocephalic Eyes:EOM are normal Neck: Normal range of motion Cardiovascular: Normal rate Pulmonary/chest: Effort normal Neurologic: Patient is alert Skin: Skin is warm Psychiatric: Patient has normal mood and affect    Ortho Exam: Ortho exam demonstrates normal gait alignment.  No effusion in either knee today.  Moderate patellofemoral crepitus is present bilaterally worse on the left-hand side.  Collateral and cruciate ligaments are stable.  She has primarily periretinacular tenderness as opposed to joint line tenderness in the left knee.  Pedal pulses intact.  Ankle dorsiflexion intact on the left.  No groin pain with internal or external rotation of the left knee.  Specialty Comments:  No specialty comments available.  Imaging: No results found.   PMFS History: Patient Active Problem List   Diagnosis Date Noted  . Osteoarthritis of left knee 04/04/2020  . Chest pain 04/04/2020  . Depressed mood 04/04/2020  . Essential hypertension, benign 04/04/2020  . Lipoma of lower extremity 04/04/2020  . Tobacco use 04/04/2020   Past Medical History:  Diagnosis Date  . Allergy   . Anxiety   . Arthritis  left knee  . Chronic back pain   . Depression   . Hypertension   . Prediabetes     Family History  Problem Relation Age of Onset  . Hypertension Mother   . Hypertension Father   . Hyperlipidemia Father     Past Surgical History:  Procedure Laterality Date  . NO PAST SURGERIES  03/2020  . no previous surgeries     Social History   Occupational History  . Not on file  Tobacco Use  . Smoking status: Current Every Day Smoker    Packs/day: 0.50    Years: 12.00    Pack years: 6.00    Types: Cigarettes  . Smokeless tobacco: Never Used  Vaping Use  . Vaping  Use: Never used  Substance and Sexual Activity  . Alcohol use: Yes  . Drug use: No  . Sexual activity: Not Currently    Birth control/protection: None

## 2020-04-27 ENCOUNTER — Other Ambulatory Visit: Payer: Self-pay

## 2020-04-27 ENCOUNTER — Ambulatory Visit
Admission: RE | Admit: 2020-04-27 | Discharge: 2020-04-27 | Disposition: A | Discharge status: Home / Self Care | Payer: No Typology Code available for payment source | Source: Ambulatory Visit | Attending: Orthopedic Surgery | Admitting: Orthopedic Surgery

## 2020-04-27 DIAGNOSIS — M25562 Pain in left knee: Secondary | ICD-10-CM

## 2020-05-12 ENCOUNTER — Encounter: Payer: No Typology Code available for payment source | Admitting: Medical

## 2020-05-15 ENCOUNTER — Encounter: Payer: Self-pay | Admitting: Medical

## 2020-05-17 ENCOUNTER — Ambulatory Visit (INDEPENDENT_AMBULATORY_CARE_PROVIDER_SITE_OTHER): Payer: No Typology Code available for payment source | Admitting: Orthopedic Surgery

## 2020-05-17 ENCOUNTER — Encounter: Payer: Self-pay | Admitting: Orthopedic Surgery

## 2020-05-17 DIAGNOSIS — M1712 Unilateral primary osteoarthritis, left knee: Secondary | ICD-10-CM

## 2020-05-17 NOTE — Progress Notes (Signed)
Office Visit Note   Patient: Nicole Deleon           Date of Birth: Jan 25, 1980           MRN: [id number] Visit Date: 05/17/2020 Requested by: Carlena Hurl, PA-C [address] [place],  [place] [address] PCP: Carlena Hurl, PA-C  Subjective: Chief Complaint  Patient presents with  . Left Knee - Follow-up    HPI: Nicole Deleon is a 40 y.o. female who presents to the office complaining of left knee pain.  Patient returns to discuss MRI results of left knee.  Patient complains of continued left knee pain since about 2014.  She has no history of surgery but she does describe 1 prior patellar dislocation that was 2 years ago with no recurrence following that.  Localizes most of her pain to the anterior aspect of the left knee with occasional radiation down into the shin.  No significant groin pain or radicular pain.  She is currently using knee brace.  Knee gives out on her on occasion and she has to use a cane at times.  She is currently a Freight forwarder at [employer] and this causes her to miss work and reduce her hours because of the pain.  She wakes with pain at night.  She has had previous cortisone and gel injections over the last 6 years.  Gel injections lasted couple months as well as a cortisone injections but she has had no relief with the past several injections.  Going up stairs causes her more pain.  Downstairs is not as bad.  Denies any history of diabetes, DVT, PE.  Her job involves standing for long periods of time.  No significant medical history aside from hypertension for which she is on low-dose medications..                ROS: All systems reviewed are negative as they relate to the chief complaint within the history of present illness.  Patient denies fevers or chills.  Assessment & Plan: Visit Diagnoses:  1. Patellofemoral arthritis of left knee     Plan: Patient is a 40 year old female who presents complaining of left knee pain.  She has long history of left knee  pain with severe patellofemoral arthritis.  She had MRI of the left knee for evaluation of the articular cartilage.  Medial and lateral compartments are well preserved but there is severe cartilage loss along the lateral patellar facet of the patellofemoral compartment.  No other findings noted on MRI aside from a cyst that is medial to the medial meniscus but there is no evidence of meniscal injury.  Discussed options available to patient.  She has had no relief from injections recently and she does not want to try physical therapy as she is afraid this will make her pain worse.  Discussed patellofemoral arthroplasty as a option.  Discussed the risks and benefits of the procedure including knee stiffness, patellar instability, need for revision surgery, prosthetic joint infection, medical complication from surgery including DVT, pulmonary embolism, heart issues..  After lengthy discussion, patient would like to proceed with surgery.  She understands that there is a high risk that she will need this procedure revised in the future given her young age.  She also understands she will not be able to kneel on her knee after this procedure.  Plan to post patient for surgery and follow-up after procedure.  In general we do not have a great option for this  particular problem in terms of cartilage restoration.  The rest of her knee looks pretty reasonable on MRI scan.  She does have a very small meniscal cyst on that medial side but no discrete tear and no real significant symptoms present in this location.  Most of her pain localizes anteriorly.  Tibial tubercle trochlear groove distance not excessive and her alignment also is not unfavorable in terms of patellar instability.  Follow-Up Instructions: No follow-ups on file.   Orders:  No orders of the defined types were placed in this encounter.  No orders of the defined types were placed in this encounter.     Procedures: No procedures performed   Clinical  Data: No additional findings.  Objective: Vital Signs: There were no vitals taken for this visit.  Physical Exam:  Constitutional: Patient appears well-developed HEENT:  Head: Normocephalic Eyes:EOM are normal Neck: Normal range of motion Cardiovascular: Normal rate Pulmonary/chest: Effort normal Neurologic: Patient is alert Skin: Skin is warm Psychiatric: Patient has normal mood and affect  Ortho Exam: Ortho exam demonstrates left knee with no effusion.  She does have crepitus in the patellofemoral joint with side to side motion of the patella as well as with passive flexion and extension of the knee.  No calf tenderness.  She is able to extend the leg and perform straight leg raise without difficulty.  She has tenderness over the medial and lateral joint line diffusely with no focal area of tenderness.  Positive patellar grind test.  No pain with hip range of motion.  Specialty Comments:  No specialty comments available.  Imaging: No results found.   PMFS History: Patient Active Problem List   Diagnosis Date Noted  . Osteoarthritis of left knee 04/04/2020  . Chest pain 04/04/2020  . Depressed mood 04/04/2020  . Essential hypertension, benign 04/04/2020  . Lipoma of lower extremity 04/04/2020  . Tobacco use 04/04/2020   Past Medical History:  Diagnosis Date  . Allergy   . Anxiety   . Arthritis    left knee  . Chronic back pain   . Depression   . Hypertension   . Prediabetes     Family History  Problem Relation Age of Onset  . Hypertension Mother   . Hypertension Father   . Hyperlipidemia Father     Past Surgical History:  Procedure Laterality Date  . NO PAST SURGERIES  03/2020  . no previous surgeries     Social History   Occupational History  . Not on file  Tobacco Use  . Smoking status: Current Every Day Smoker    Packs/day: 0.50    Years: 12.00    Pack years: 6.00    Types: Cigarettes  . Smokeless tobacco: Never Used  Vaping Use  . Vaping  Use: Never used  Substance and Sexual Activity  . Alcohol use: Yes  . Drug use: No  . Sexual activity: Not Currently    Birth control/protection: None

## 2020-05-23 ENCOUNTER — Encounter: Payer: Self-pay | Admitting: Medical

## 2020-06-22 NOTE — Progress Notes (Signed)
Surgical Instructions    Your procedure is scheduled on 06/29/20.  Report to [facility] Main Entrance "A" at 10:15 A.M., then check in with the Admitting office.  Call this number if you have problems the morning of surgery:  [phone]   If you have any questions prior to your surgery date call [phone]: Open Monday-Friday 8am-4pm    Remember:  Do not eat after midnight the night before your surgery  You may drink clear liquids until 09:15am the morning of your surgery.   Clear liquids allowed are: Water, Non-Citrus Juices (without pulp), Carbonated Beverages, Clear Tea, Black Coffee Only, and Gatorade    Take these medicines the morning of surgery with A SIP OF WATER acetaminophen (TYLENOL) if needed buPROPion (WELLBUTRIN XL)     As of today, STOP taking any Aspirin (unless otherwise instructed by your surgeon) Aleve, Naproxen, Ibuprofen, Motrin, Advil, Goody's, BC's, all herbal medications, fish oil, and all vitamins.    HOW TO MANAGE YOUR DIABETES BEFORE AND AFTER SURGERY  Why is it important to control my blood sugar before and after surgery? Improving blood sugar levels before and after surgery helps healing and can limit problems. A way of improving blood sugar control is eating a healthy diet by:  Eating less sugar and carbohydrates  Increasing activity/exercise  Talking with your doctor about reaching your blood sugar goals High blood sugars (greater than 180 mg/dL) can raise your risk of infections and slow your recovery, so you will need to focus on controlling your diabetes during the weeks before surgery. Make sure that the doctor who takes care of your diabetes knows about your planned surgery including the date and location.  How do I manage my blood sugar before surgery? Check your blood sugar at least 4 times a day, starting 2 days before surgery, to make sure that the level is not too high or low.  Check your blood sugar the morning of your surgery  when you wake up and every 2 hours until you get to the Short Stay unit.  If your blood sugar is less than 70 mg/dL, you will need to treat for low blood sugar: Do not take insulin. Treat a low blood sugar (less than 70 mg/dL) with  cup of clear juice (cranberry or apple), 4 glucose tablets, OR glucose gel. Recheck blood sugar in 15 minutes after treatment (to make sure it is greater than 70 mg/dL). If your blood sugar is not greater than 70 mg/dL on recheck, call [phone] for further instructions. Report your blood sugar to the short stay nurse when you get to Short Stay.  If you are admitted to the hospital after surgery: Your blood sugar will be checked by the staff and you will probably be given insulin after surgery (instead of oral diabetes medicines) to make sure you have good blood sugar levels. The goal for blood sugar control after surgery is 80-180 mg/dL.           Do not wear jewelry or makeup Do not wear lotions, powders, perfumes, or deodorant. Do not shave 48 hours prior to surgery.   Do not bring valuables to the hospital. DO Not wear nail polish, gel polish, artificial nails, or any other type of covering on natural nails  including finger and toenails. If patients have artificial nails, gel coating, etc. that need to be removed by a nail salon please have this removed prior to surgery or surgery may need to be canceled/delayed if the surgeon/ anesthesia  feels like the patient is unable to be adequately monitored.             [facility] is not responsible for any belongings or valuables.  Do NOT Smoke (Tobacco/Vaping) or drink Alcohol 24 hours prior to your procedure If you use a CPAP at night, you may bring all equipment for your overnight stay.   Contacts, glasses, dentures or bridgework may not be worn into surgery, please bring cases for these belongings   For patients admitted to the hospital, discharge time will be determined by your treatment team.    Patients discharged the day of surgery will not be allowed to drive home, and someone needs to stay with them for 24 hours.  ONLY 1 SUPPORT PERSON MAY BE PRESENT WHILE YOU ARE IN SURGERY. IF YOU ARE TO BE ADMITTED ONCE YOU ARE IN YOUR ROOM YOU WILL BE ALLOWED TWO (2) VISITORS.  Minor children may have two parents present. Special consideration for safety and communication needs will be reviewed on a case by case basis.  Special instructions:    Oral Hygiene is also important to reduce your risk of infection.  Remember - BRUSH YOUR TEETH THE MORNING OF SURGERY WITH YOUR REGULAR TOOTHPASTE   [facility]- Preparing For Surgery  Before surgery, you can play an important role. Because skin is not sterile, your skin needs to be as free of germs as possible. You can reduce the number of germs on your skin by washing with CHG (chlorahexidine gluconate) Soap before surgery.  CHG is an antiseptic cleaner which kills germs and bonds with the skin to continue killing germs even after washing.     Please do not use if you have an allergy to CHG or antibacterial soaps. If your skin becomes reddened/irritated stop using the CHG.  Do not shave (including legs and underarms) for at least 48 hours prior to first CHG shower. It is OK to shave your face.  Please follow these instructions carefully.     Shower the NIGHT BEFORE SURGERY and the MORNING OF SURGERY with CHG Soap.   If you chose to wash your hair, wash your hair first as usual with your normal shampoo. After you shampoo, rinse your hair and body thoroughly to remove the shampoo.  Then [employer] and genitals (private parts) with your normal soap and rinse thoroughly to remove soap.  After that Use CHG Soap as you would any other liquid soap. You can apply CHG directly to the skin and wash gently with a scrungie or a clean washcloth.   Apply the CHG Soap to your body ONLY FROM THE NECK DOWN.  Do not use on open wounds or open sores. Avoid contact  with your eyes, ears, mouth and genitals (private parts). Wash Face and genitals (private parts)  with your normal soap.   Wash thoroughly, paying special attention to the area where your surgery will be performed.  Thoroughly rinse your body with warm water from the neck down.  DO NOT shower/wash with your normal soap after using and rinsing off the CHG Soap.  Pat yourself dry with a CLEAN TOWEL.  Wear CLEAN PAJAMAS to bed the night before surgery  Place CLEAN SHEETS on your bed the night before your surgery  DO NOT SLEEP WITH PETS.   Day of Surgery: Take a shower with CHG soap. Wear Clean/Comfortable clothing the morning of surgery Do not apply any deodorants/lotions.   Remember to brush your teeth WITH YOUR REGULAR TOOTHPASTE.  Please read over the following fact sheets that you were given.

## 2020-06-23 ENCOUNTER — Other Ambulatory Visit: Payer: Self-pay

## 2020-06-23 ENCOUNTER — Telehealth: Payer: Self-pay | Admitting: Orthopedic Surgery

## 2020-06-23 ENCOUNTER — Encounter (HOSPITAL_COMMUNITY): Payer: Self-pay

## 2020-06-23 ENCOUNTER — Telehealth: Payer: Self-pay

## 2020-06-23 ENCOUNTER — Encounter (HOSPITAL_COMMUNITY)
Admission: RE | Admit: 2020-06-23 | Discharge: 2020-06-23 | Disposition: A | Discharge status: Home / Self Care | Payer: PRIVATE HEALTH INSURANCE | Source: Ambulatory Visit | Attending: Orthopedic Surgery | Admitting: Orthopedic Surgery

## 2020-06-23 ENCOUNTER — Encounter (HOSPITAL_COMMUNITY): Payer: Self-pay | Admitting: Vascular Surgery

## 2020-06-23 DIAGNOSIS — F17210 Nicotine dependence, cigarettes, uncomplicated: Secondary | ICD-10-CM | POA: Insufficient documentation

## 2020-06-23 DIAGNOSIS — I10 Essential (primary) hypertension: Secondary | ICD-10-CM | POA: Insufficient documentation

## 2020-06-23 DIAGNOSIS — R7303 Prediabetes: Secondary | ICD-10-CM | POA: Insufficient documentation

## 2020-06-23 DIAGNOSIS — M549 Dorsalgia, unspecified: Secondary | ICD-10-CM | POA: Insufficient documentation

## 2020-06-23 DIAGNOSIS — F32A Depression, unspecified: Secondary | ICD-10-CM | POA: Insufficient documentation

## 2020-06-23 DIAGNOSIS — Z79899 Other long term (current) drug therapy: Secondary | ICD-10-CM | POA: Insufficient documentation

## 2020-06-23 DIAGNOSIS — E669 Obesity, unspecified: Secondary | ICD-10-CM | POA: Insufficient documentation

## 2020-06-23 DIAGNOSIS — G8929 Other chronic pain: Secondary | ICD-10-CM | POA: Insufficient documentation

## 2020-06-23 DIAGNOSIS — M13862 Other specified arthritis, left knee: Secondary | ICD-10-CM | POA: Insufficient documentation

## 2020-06-23 DIAGNOSIS — Z6839 Body mass index (BMI) 39.0-39.9, adult: Secondary | ICD-10-CM | POA: Insufficient documentation

## 2020-06-23 DIAGNOSIS — F419 Anxiety disorder, unspecified: Secondary | ICD-10-CM | POA: Insufficient documentation

## 2020-06-23 DIAGNOSIS — Z01812 Encounter for preprocedural laboratory examination: Secondary | ICD-10-CM | POA: Insufficient documentation

## 2020-06-23 HISTORY — DX: Headache, unspecified: R51.9

## 2020-06-23 HISTORY — DX: Prediabetes: R73.03

## 2020-06-23 LAB — URINALYSIS, ROUTINE W REFLEX MICROSCOPIC
Bilirubin Urine: NEGATIVE
Glucose, UA: NEGATIVE mg/dL
Ketones, ur: NEGATIVE mg/dL
Nitrite: NEGATIVE
Protein, ur: 30 mg/dL — AB
Specific Gravity, Urine: 1.018 (ref 1.005–1.030)
pH: 6 (ref 5.0–8.0)

## 2020-06-23 LAB — CBC
HCT: 40.5 % (ref 36.0–46.0)
Hemoglobin: 12.9 g/dL (ref 12.0–15.0)
MCH: 27 pg (ref 26.0–34.0)
MCHC: 31.9 g/dL (ref 30.0–36.0)
MCV: 84.7 fL (ref 80.0–100.0)
Platelets: 398 10*3/uL (ref 150–400)
RBC: 4.78 MIL/uL (ref 3.87–5.11)
RDW: 15.4 % (ref 11.5–15.5)
WBC: 10.8 10*3/uL — ABNORMAL HIGH (ref 4.0–10.5)
nRBC: 0 % (ref 0.0–0.2)

## 2020-06-23 LAB — BASIC METABOLIC PANEL
Anion gap: 6 (ref 5–15)
BUN: <5 mg/dL — ABNORMAL LOW (ref 6–20)
CO2: 24 mmol/L (ref 22–32)
Calcium: 8.8 mg/dL — ABNORMAL LOW (ref 8.9–10.3)
Chloride: 109 mmol/L (ref 98–111)
Creatinine, Ser: 0.68 mg/dL (ref 0.44–1.00)
GFR, Estimated: >60 mL/min (ref >60)
Glucose, Bld: 95 mg/dL (ref 70–99)
Potassium: 3.5 mmol/L (ref 3.5–5.1)
Sodium: 139 mmol/L (ref 135–145)

## 2020-06-23 LAB — HEMOGLOBIN A1C
Hgb A1c MFr Bld: 5.8 % — ABNORMAL HIGH (ref 4.8–5.6)
Mean Plasma Glucose: 120 mg/dL

## 2020-06-23 LAB — GLUCOSE, CAPILLARY: Glucose-Capillary: 112 mg/dL — ABNORMAL HIGH (ref 70–99)

## 2020-06-23 NOTE — Progress Notes (Signed)
Notified dr deans office of abnormal u/a results.

## 2020-06-23 NOTE — Progress Notes (Addendum)
Anesthesia Chart Review:  Case: [id number] Date/Time: 06/29/20 1200   Procedure: LEFT PATELLA-FEMORAL ARTHROPLASTY (Left: Knee)   Anesthesia type: Spinal   Pre-op diagnosis: left patello femoral arthritis   Location: [facility] OR ROOM 11 / [facility]   Surgeons: Meredith Pel, MD       DISCUSSION: Patient is a 40 year old female scheduled for the above procedure.  History includes smoking, HTN, pre-diabetes, anxiety, depression, headaches, chronic back pain. BMI is consistent with obesity.    BP at PAT 142/115, 141/112. She recently got re-established with primary care Glade Lloyd, Camelia Eng, PA-C) on 04/04/20 after living in the area for the past two years. (Previously saw a PCP in [place], [place].) BP at that visit was 144/94, and HCTZ 25 mg daily restarted. However, she reported that even home BP readings over the past 2 days have shown DBP in the 110's. No chest pain or SOB, or headaches per PAT RN interview. Patient advised to contact PCP after PAT visit for recommendations on HTN management, as a significantly elevated BP could lead to case delay or cancellation. I also notified Debbie at Dr. Randel Pigg office. Since PAT, patient has contacted [facility] and is awaiting further recommendations.   Will leave chart for follow-up PCP recommendations. A1c and urine culture are also in process.   Preoperative COVID-19 test is scheduled for 06/26/2020.  UPDATE 06/27/20 5:51 PM: A1c 5.8%. Urine culture grew multiple species, suggest recollection. Dr. Marlou Sa is prescribing Septa x  5 days. 06/26/20 COVID-19 test negative. PCP office has reached back out to patient regarding HTN follow-up, but otherwise I don't see specific communication. I have updated Debbie at Dr. Randel Pigg office who called and spoke with patient. She says patient says she was only taking "10 mg" of HCTZ when she was seen at PAT, but now she is taking HCTZ 25 mg daily which had been prescribed in March. Reported home BP this morning was  115/85. Although reported BP is much improved from her PAT readings, she was still encouraged to follow-up with PCP for either provider or nurse visit to recheck BP since a significantly elevated BP could result in case delay or cancellation.    VS: BP (!) 141/112   Pulse 82   Temp 36.8 C (Oral)   Resp 18   Ht 5\' 11"  (1.803 m)   Wt 128 kg   LMP 06/09/2020   SpO2 98%   BMI 39.34 kg/m    PROVIDERS: Carlena Hurl, PA-C is PCP ([place])   LABS: Preoperative labs noted. UA results called to Dr. Randel Pigg office by PAT RN. (all labs ordered are listed, but only abnormal results are displayed)  Labs Reviewed  URINE CULTURE - Abnormal; Notable for the following components:      Result Value   Culture MULTIPLE SPECIES PRESENT, SUGGEST RECOLLECTION (*)    All other components within normal limits  GLUCOSE, CAPILLARY - Abnormal; Notable for the following components:   Glucose-Capillary 112 (*)    All other components within normal limits  HEMOGLOBIN A1C - Abnormal; Notable for the following components:   Hgb A1c MFr Bld 5.8 (*)    All other components within normal limits  CBC - Abnormal; Notable for the following components:   WBC 10.8 (*)    All other components within normal limits  BASIC METABOLIC PANEL - Abnormal; Notable for the following components:   BUN <5 (*)    Calcium 8.8 (*)    All other components within normal  limits  URINALYSIS, ROUTINE W REFLEX MICROSCOPIC - Abnormal; Notable for the following components:   APPearance CLOUDY (*)    Hgb urine dipstick SMALL (*)    Protein, ur 30 (*)    Leukocytes,Ua LARGE (*)    Bacteria, UA RARE (*)    All other components within normal limits     IMAGES: MRI Left Knee 04/27/20: IMPRESSION: - Dominant finding is advanced for age patellofemoral osteoarthritis. The medial and lateral compartments are preserved. - Septated cyst along the body of the medial meniscus may be a parameniscal cyst but no meniscal  tear is identified. The cyst may represent a ganglion.    EKG: 04/04/20: NSR, rightward axis.    CV: N/A  Past Medical History:  Diagnosis Date   Allergy    Anxiety    Arthritis    left knee   Chronic back pain    Depression    Headache    Hypertension    Pre-diabetes     Past Surgical History:  Procedure Laterality Date   NO PAST SURGERIES  03/2020   no previous surgeries     TUBAL LIGATION      MEDICATIONS:  acetaminophen (TYLENOL) 500 MG tablet   buPROPion (WELLBUTRIN XL) 150 MG 24 hr tablet   hydrochlorothiazide (HYDRODIURIL) 25 MG tablet   Multiple Vitamin (MULTIVITAMIN WITH MINERALS) TABS tablet   No current facility-administered medications for this encounter.    Myra Gianotti, PA-C Surgical Short Stay/Anesthesiology [facility] Phone [phone] [facility] Phone [phone] 06/23/2020 4:49 PM

## 2020-06-23 NOTE — Progress Notes (Addendum)
PCP - Chana Bode Cardiologist - denies  PPM/ICD - denies   Chest x-ray - n/a EKG - 04/04/20 Stress Test - denies ECHO - denies Cardiac Cath - denies  Sleep Study - denies   Does not check CBG at home (pt is pre diabetic)  As of today, STOP taking any Aspirin (unless otherwise instructed by your surgeon) Aleve, Naproxen, Ibuprofen, Motrin, Advil, Goody's, BC's, all herbal medications, fish oil, and all vitamins.  ERAS Protcol -yes PRE-SURGERY Ensure or G2- water and instructions given  COVID TEST- scheduled for 06/26/20 at 930am at drive thru   Anesthesia review: yes BP 142/115. Ebony Hail, PA-C notified during PAT appt and does not need to see patient. Instructed me to tell patient to call her PCP today and notify him of current BP readings and to continue to check BP at home. Pt informed.   __notified dr. Marlou Sa office of abnormal u/a result__  Patient denies shortness of breath, fever, cough and chest pain at PAT appointment   All instructions explained to the patient, with a verbal understanding of the material. Patient agrees to go over the instructions while at home for a better understanding. Patient also instructed to self quarantine after being tested for COVID-19. The opportunity to ask questions was provided.

## 2020-06-23 NOTE — Telephone Encounter (Signed)
Pt. Called stating she just left her pre-op apt and they told her that her BP was elevated and she needed to contact her PCP to see if her medication could possible be increased. She said her BP was like 136/110. She stats her surgery is next Thursday.

## 2020-06-24 LAB — URINE CULTURE

## 2020-06-26 ENCOUNTER — Other Ambulatory Visit (HOSPITAL_COMMUNITY)
Admission: RE | Admit: 2020-06-26 | Discharge: 2020-06-26 | Disposition: A | Discharge status: Home / Self Care | Payer: Medicaid Other | Source: Ambulatory Visit | Attending: Orthopedic Surgery | Admitting: Orthopedic Surgery

## 2020-06-26 ENCOUNTER — Telehealth: Payer: Self-pay | Admitting: Orthopedic Surgery

## 2020-06-26 DIAGNOSIS — Z01812 Encounter for preprocedural laboratory examination: Secondary | ICD-10-CM | POA: Diagnosis not present

## 2020-06-26 DIAGNOSIS — Z20822 Contact with and (suspected) exposure to covid-19: Secondary | ICD-10-CM | POA: Diagnosis not present

## 2020-06-26 LAB — SARS CORONAVIRUS 2 (TAT 6-24 HRS): SARS Coronavirus 2: NEGATIVE

## 2020-06-26 NOTE — Telephone Encounter (Signed)
[place] with [facility] pre-op left message on voicemail Friday 06-23-20 @ 3:28pm.  Patient had pre-op. Urinalysis was done and results came back with large number of leukocytes.  FYI in case you want to call in an antibiotic.    Patient's left PF arthroplasty is scheduled at [facility] on June 23rd at 12:15pm

## 2020-06-26 NOTE — Telephone Encounter (Signed)
Left VM for pt to call the office and schedule follow up appt.

## 2020-06-26 NOTE — Telephone Encounter (Signed)
Please call in Septra DS 1 p.o. twice daily x5 days to start tomorrow.  Thanks

## 2020-06-27 ENCOUNTER — Telehealth: Payer: Self-pay | Admitting: Orthopedic Surgery

## 2020-06-27 MED ORDER — SULFAMETHOXAZOLE-TRIMETHOPRIM 800-160 MG PO TABS: ORAL_TABLET | ORAL | 0 refills | Status: DC

## 2020-06-27 NOTE — Telephone Encounter (Signed)
FYI Received call from patient concerning Rx. I read message to her and patient advised will pick up Rx today.

## 2020-06-27 NOTE — Telephone Encounter (Signed)
Rx submitted to pharmacy. Called patient and LM for her advising to start medication today. Will try to reach her again later today to make sure she did get message.

## 2020-06-27 NOTE — Telephone Encounter (Signed)
See other note. Patient confirmed she received VM

## 2020-06-28 ENCOUNTER — Other Ambulatory Visit: Payer: Self-pay

## 2020-06-28 ENCOUNTER — Encounter (HOSPITAL_COMMUNITY): Payer: Self-pay

## 2020-06-28 ENCOUNTER — Ambulatory Visit: Payer: Medicaid Other | Admitting: Medical

## 2020-06-28 ENCOUNTER — Ambulatory Visit (HOSPITAL_COMMUNITY)
Admission: EM | Admit: 2020-06-28 | Discharge: 2020-06-28 | Disposition: A | Discharge status: Home / Self Care | Payer: Self-pay

## 2020-06-28 DIAGNOSIS — I10 Essential (primary) hypertension: Secondary | ICD-10-CM

## 2020-06-28 MED ORDER — TRANEXAMIC ACID 1000 MG/10ML IV SOLN
2000.0000 mg | INTRAVENOUS | Status: AC
  Filled 2020-06-28: qty 20

## 2020-06-28 NOTE — ED Triage Notes (Signed)
Pt reports had high blood pressure reading at home Friday, about 135/113. Reports had not had her BP meds for a few days at that time, but was able to get them and restart them Friday. States BP was about 110/85 after meds but would like a recheck.

## 2020-06-28 NOTE — ED Provider Notes (Signed)
[place]    CSN: [id number] Arrival date & time: 06/28/20  0973      History   Chief Complaint Chief Complaint  Patient presents with   Hypertension    HPI Nicole Deleon is a 40 y.o. female.   HPI Patient presents today for a blood pressure check.  Patient reports been off of her blood pressure medicine for several days and recently restarted back on her medication 5 days ago.  She was concerned as she had a drop in her blood pressure the day after resuming medication.  However has been checking her blood pressure and has had readings in the low 130s over 100.  In today for blood pressure recheck.  Past Medical History:  Diagnosis Date   Allergy    Anxiety    Arthritis    left knee   Chronic back pain    Depression    Headache    Hypertension    Pre-diabetes     Patient Active Problem List   Diagnosis Date Noted   Prediabetes 06/23/2020   Osteoarthritis of left knee 04/04/2020   Chest pain 04/04/2020   Depressed mood 04/04/2020   Essential hypertension, benign 04/04/2020   Lipoma of lower extremity 04/04/2020   Tobacco use 04/04/2020    Past Surgical History:  Procedure Laterality Date   NO PAST SURGERIES  03/2020   no previous surgeries     TUBAL LIGATION      OB History   No obstetric history on file.      Home Medications    Prior to Admission medications   Medication Sig Start Date End Date Taking? Authorizing Provider  acetaminophen (TYLENOL) 500 MG tablet Take 500 mg by mouth 3 (three) times daily as needed for moderate pain.   Yes [provider]  buPROPion (WELLBUTRIN XL) 150 MG 24 hr tablet Take 1 tablet (150 mg total) by mouth daily. 04/04/20  Yes Tysinger, Camelia Eng, PA-C  hydrochlorothiazide (HYDRODIURIL) 25 MG tablet Take 1 tablet (25 mg total) by mouth daily. 04/04/20  Yes Tysinger, Camelia Eng, PA-C  Multiple Vitamin (MULTIVITAMIN WITH MINERALS) TABS tablet Take 1 tablet by mouth daily.   Yes [provider]   sulfamethoxazole-trimethoprim (BACTRIM DS) 800-160 MG tablet 1 p.o. twice daily x 5 days 06/27/20  Yes Dean, Nicole Corner, MD    Family History Family History  Problem Relation Age of Onset   Hypertension Mother    Hypertension Father    Hyperlipidemia Father     Social History Social History   Tobacco Use   Smoking status: Former    Packs/day: 0.50    Years: 12.00    Pack years: 6.00    Types: Cigarettes   Smokeless tobacco: Never   Tobacco comments:    Pt states had last cigarette yesterday 06/27/20.  Vaping Use   Vaping Use: Former  Substance Use Topics   Alcohol use: Not Currently    Comment: occassional   Drug use: Not Currently    Types: Marijuana    Comment: stopped 2 weeks ago     Allergies   Patient has no known allergies.   Review of Systems Review of Systems Pertinent negatives listed in HPI   Physical Exam Triage Vital Signs ED Triage Vitals  Enc Vitals Group     BP 06/28/20 1000 (!) 127/103     Pulse Rate 06/28/20 1000 96     Resp 06/28/20 1000 18     Temp 06/28/20 1000 99.1  F (37.3 C)     Temp src --      SpO2 06/28/20 1000 100 %     Weight --      Height --      Head Circumference --      Peak Flow --      Pain Score 06/28/20 0956 0     Pain Loc --      Pain Edu? --      Excl. in [place]? --    No data found.  Updated Vital Signs BP (!) 127/103   Pulse 96   Temp 99.1 F (37.3 C)   Resp 18   LMP 06/09/2020   SpO2 100%   Visual Acuity Right Eye Distance:   Left Eye Distance:   Bilateral Distance:    Right Eye Near:   Left Eye Near:    Bilateral Near:     Physical Exam  General appearance: alert, well developed, well nourished, cooperative  Head: Normocephalic, without obvious abnormality, atraumatic Respiratory: Respirations even and unlabored, normal respiratory rate Heart: Rate and rhythm normal. No gallop or murmurs noted on exam  Abdomen: BS +, no distention, no rebound tenderness, or no mass Extremities: Negative  for any lower extremity edema Skin: Skin color, texture, turgor normal. No rashes seen  Psych: Appropriate mood and affect. Neurologic: GCS 15, normal coordination normal gait UC Treatments / Results  Labs (all labs ordered are listed, but only abnormal results are displayed) Labs Reviewed - No data to display  EKG   Radiology No results found.  Procedures Procedures (including critical care time)  Medications Ordered in UC Medications - No data to display  Initial Impression / Assessment and Plan / UC Course  I have reviewed the triage vital signs and the nursing notes.  Pertinent labs & imaging results that were available during my care of the patient were reviewed by me and considered in my medical decision making (see chart for details).    Manuel check of blood pressure reading 120/85 manually left arm. Overall exam unremarkable.  Patient advised to continue blood pressure medication.  Decrease sodium and increase physical activity.  To maintain normotensive blood pressures.  Keep follow-ups with primary care provider return precautions given. Final Clinical Impressions(s) / UC Diagnoses   Final diagnoses:  Essential hypertension   Discharge Instructions   None    ED Prescriptions   None    PDMP not reviewed this encounter.   Scot Jun, [place] 06/28/20 [phone]

## 2020-06-29 ENCOUNTER — Encounter (HOSPITAL_COMMUNITY): Payer: Self-pay | Admitting: Orthopedic Surgery

## 2020-06-29 ENCOUNTER — Observation Stay (HOSPITAL_COMMUNITY)
Admission: RE | Admit: 2020-06-29 | Discharge: 2020-06-30 | Disposition: A | Discharge status: Home / Self Care | Payer: Self-pay | Attending: Orthopedic Surgery | Admitting: Orthopedic Surgery

## 2020-06-29 ENCOUNTER — Ambulatory Visit (HOSPITAL_COMMUNITY): Payer: Self-pay | Admitting: Vascular Surgery

## 2020-06-29 ENCOUNTER — Other Ambulatory Visit: Payer: Self-pay

## 2020-06-29 ENCOUNTER — Ambulatory Visit (HOSPITAL_COMMUNITY): Payer: Self-pay | Admitting: Anesthesiology

## 2020-06-29 ENCOUNTER — Encounter (HOSPITAL_COMMUNITY)
Admission: RE | Disposition: A | Discharge status: Home / Self Care | Payer: Self-pay | Source: Home / Self Care | Attending: Orthopedic Surgery

## 2020-06-29 DIAGNOSIS — Z96652 Presence of left artificial knee joint: Secondary | ICD-10-CM

## 2020-06-29 DIAGNOSIS — M1712 Unilateral primary osteoarthritis, left knee: Principal | ICD-10-CM | POA: Insufficient documentation

## 2020-06-29 DIAGNOSIS — I10 Essential (primary) hypertension: Secondary | ICD-10-CM | POA: Insufficient documentation

## 2020-06-29 DIAGNOSIS — Z79899 Other long term (current) drug therapy: Secondary | ICD-10-CM | POA: Insufficient documentation

## 2020-06-29 DIAGNOSIS — Z87891 Personal history of nicotine dependence: Secondary | ICD-10-CM | POA: Insufficient documentation

## 2020-06-29 DIAGNOSIS — R7303 Prediabetes: Secondary | ICD-10-CM | POA: Insufficient documentation

## 2020-06-29 HISTORY — PX: PATELLA-FEMORAL ARTHROPLASTY: SHX5037

## 2020-06-29 LAB — GLUCOSE, CAPILLARY
Glucose-Capillary: 107 mg/dL — ABNORMAL HIGH (ref 70–99)
Glucose-Capillary: 113 mg/dL — ABNORMAL HIGH (ref 70–99)
Glucose-Capillary: 94 mg/dL (ref 70–99)

## 2020-06-29 LAB — POCT PREGNANCY, URINE: Preg Test, Ur: NEGATIVE

## 2020-06-29 SURGERY — ARTHROPLASTY, PATELLOFEMORAL
Anesthesia: Spinal | Site: Knee | Laterality: Left

## 2020-06-29 MED ORDER — HYDROCHLOROTHIAZIDE 25 MG PO TABS
25.0000 mg | ORAL_TABLET | Freq: Every day | ORAL | Status: DC
  Administered 2020-06-30: 25 mg via ORAL
  Filled 2020-06-29: qty 1

## 2020-06-29 MED ORDER — NAPROXEN 250 MG PO TABS
250.0000 mg | ORAL_TABLET | Freq: Two times a day (BID) | ORAL | Status: DC
  Administered 2020-06-29 – 2020-06-30 (×2): 250 mg via ORAL
  Filled 2020-06-29 (×2): qty 1

## 2020-06-29 MED ORDER — ONDANSETRON HCL 4 MG PO TABS: 4.0000 mg | ORAL_TABLET | Freq: Four times a day (QID) | ORAL | Status: DC | PRN

## 2020-06-29 MED ORDER — TRANEXAMIC ACID-NACL 1000-0.7 MG/100ML-% IV SOLN
1000.0000 mg | INTRAVENOUS | Status: AC
  Administered 2020-06-29: 1000 mg via INTRAVENOUS
  Filled 2020-06-29: qty 100

## 2020-06-29 MED ORDER — FENTANYL CITRATE (PF) 100 MCG/2ML IJ SOLN
INTRAMUSCULAR | Status: AC
  Administered 2020-06-29: 100 ug via INTRAVENOUS
  Filled 2020-06-29: qty 2

## 2020-06-29 MED ORDER — FENTANYL CITRATE (PF) 100 MCG/2ML IJ SOLN: 100.0000 ug | Freq: Once | INTRAMUSCULAR | Status: AC

## 2020-06-29 MED ORDER — SODIUM CHLORIDE 0.9 % IR SOLN
Status: DC | PRN
  Administered 2020-06-29: 3000 mL

## 2020-06-29 MED ORDER — CEFAZOLIN SODIUM-DEXTROSE 2-4 GM/100ML-% IV SOLN
2.0000 g | Freq: Four times a day (QID) | INTRAVENOUS | Status: AC
  Administered 2020-06-29 – 2020-06-30 (×2): 2 g via INTRAVENOUS
  Filled 2020-06-29 (×2): qty 100

## 2020-06-29 MED ORDER — MIDAZOLAM HCL 2 MG/2ML IJ SOLN
INTRAMUSCULAR | Status: AC
  Filled 2020-06-29: qty 2

## 2020-06-29 MED ORDER — POVIDONE-IODINE 7.5 % EX SOLN
Freq: Once | CUTANEOUS | Status: DC
  Filled 2020-06-29: qty 118

## 2020-06-29 MED ORDER — LACTATED RINGERS IV SOLN: INTRAVENOUS | Status: DC

## 2020-06-29 MED ORDER — FENTANYL CITRATE (PF) 100 MCG/2ML IJ SOLN: 25.0000 ug | INTRAMUSCULAR | Status: DC | PRN

## 2020-06-29 MED ORDER — METOCLOPRAMIDE HCL 5 MG PO TABS: 5.0000 mg | ORAL_TABLET | Freq: Three times a day (TID) | ORAL | Status: DC | PRN

## 2020-06-29 MED ORDER — BUPIVACAINE IN DEXTROSE 0.75-8.25 % IT SOLN
INTRATHECAL | Status: DC | PRN
  Administered 2020-06-29: 2 mL via INTRATHECAL

## 2020-06-29 MED ORDER — METOCLOPRAMIDE HCL 5 MG/ML IJ SOLN: 5.0000 mg | Freq: Three times a day (TID) | INTRAMUSCULAR | Status: DC | PRN

## 2020-06-29 MED ORDER — 0.9 % SODIUM CHLORIDE (POUR BTL) OPTIME
TOPICAL | Status: DC | PRN
  Administered 2020-06-29: 2000 mL
  Administered 2020-06-29: 1000 mL

## 2020-06-29 MED ORDER — PROMETHAZINE HCL 25 MG/ML IJ SOLN: 6.2500 mg | INTRAMUSCULAR | Status: DC | PRN

## 2020-06-29 MED ORDER — ORAL CARE MOUTH RINSE: 15.0000 mL | Freq: Once | OROMUCOSAL | Status: AC

## 2020-06-29 MED ORDER — ACETAMINOPHEN 500 MG PO TABS
1000.0000 mg | ORAL_TABLET | Freq: Four times a day (QID) | ORAL | Status: AC
  Administered 2020-06-29 – 2020-06-30 (×4): 1000 mg via ORAL
  Filled 2020-06-29 (×4): qty 2

## 2020-06-29 MED ORDER — BUPIVACAINE HCL (PF) 0.25 % IJ SOLN
INTRAMUSCULAR | Status: DC | PRN
  Administered 2020-06-29: 30 mL

## 2020-06-29 MED ORDER — MIDAZOLAM HCL 2 MG/2ML IJ SOLN: 2.0000 mg | Freq: Once | INTRAMUSCULAR | Status: AC

## 2020-06-29 MED ORDER — PROPOFOL 10 MG/ML IV BOLUS
INTRAVENOUS | Status: AC
  Filled 2020-06-29: qty 20

## 2020-06-29 MED ORDER — ACETAMINOPHEN 325 MG PO TABS: 325.0000 mg | ORAL_TABLET | Freq: Four times a day (QID) | ORAL | Status: DC | PRN

## 2020-06-29 MED ORDER — BUPIVACAINE-EPINEPHRINE 0.5% -1:200000 IJ SOLN
INTRAMUSCULAR | Status: DC | PRN
  Administered 2020-06-29: 50 mL

## 2020-06-29 MED ORDER — BUPIVACAINE LIPOSOME 1.3 % IJ SUSP
INTRAMUSCULAR | Status: DC | PRN
  Administered 2020-06-29: 20 mL

## 2020-06-29 MED ORDER — POVIDONE-IODINE 10 % EX SWAB
2.0000 "application " | Freq: Once | CUTANEOUS | Status: AC
  Administered 2020-06-29: 2 via TOPICAL

## 2020-06-29 MED ORDER — DOCUSATE SODIUM 100 MG PO CAPS
100.0000 mg | ORAL_CAPSULE | Freq: Two times a day (BID) | ORAL | Status: DC
  Administered 2020-06-29 – 2020-06-30 (×2): 100 mg via ORAL
  Filled 2020-06-29 (×2): qty 1

## 2020-06-29 MED ORDER — CLONIDINE HCL (ANALGESIA) 100 MCG/ML EP SOLN
EPIDURAL | Status: DC | PRN
  Administered 2020-06-29: 100 ug

## 2020-06-29 MED ORDER — OXYCODONE HCL 5 MG PO TABS
5.0000 mg | ORAL_TABLET | ORAL | Status: DC | PRN
  Administered 2020-06-29: 5 mg via ORAL
  Administered 2020-06-29 – 2020-06-30 (×4): 10 mg via ORAL
  Filled 2020-06-29: qty 2
  Filled 2020-06-29: qty 1
  Filled 2020-06-29 (×4): qty 2

## 2020-06-29 MED ORDER — PHENYLEPHRINE HCL (PRESSORS) 10 MG/ML IV SOLN
INTRAVENOUS | Status: DC | PRN
  Administered 2020-06-29: 100 ug via INTRAVENOUS

## 2020-06-29 MED ORDER — PHENYLEPHRINE HCL-NACL 10-0.9 MG/250ML-% IV SOLN
INTRAVENOUS | Status: DC | PRN
  Administered 2020-06-29: 50 ug/min via INTRAVENOUS

## 2020-06-29 MED ORDER — MENTHOL 3 MG MT LOZG: 1.0000 | LOZENGE | OROMUCOSAL | Status: DC | PRN

## 2020-06-29 MED ORDER — VANCOMYCIN HCL 1000 MG IV SOLR
INTRAVENOUS | Status: AC
  Filled 2020-06-29: qty 1000

## 2020-06-29 MED ORDER — BUPROPION HCL ER (XL) 150 MG PO TB24
150.0000 mg | ORAL_TABLET | Freq: Every day | ORAL | Status: DC
  Administered 2020-06-29 – 2020-06-30 (×2): 150 mg via ORAL
  Filled 2020-06-29 (×2): qty 1

## 2020-06-29 MED ORDER — BUPIVACAINE-EPINEPHRINE 0.5% -1:200000 IJ SOLN
INTRAMUSCULAR | Status: AC
  Filled 2020-06-29: qty 1

## 2020-06-29 MED ORDER — CHLORHEXIDINE GLUCONATE 0.12 % MT SOLN
15.0000 mL | Freq: Once | OROMUCOSAL | Status: AC
  Administered 2020-06-29: 15 mL via OROMUCOSAL
  Filled 2020-06-29: qty 15

## 2020-06-29 MED ORDER — CEFAZOLIN IN SODIUM CHLORIDE 3-0.9 GM/100ML-% IV SOLN
3.0000 g | INTRAVENOUS | Status: AC
Start: 2020-06-29 — End: 2020-06-29
  Administered 2020-06-29: 3 g via INTRAVENOUS
  Filled 2020-06-29: qty 100

## 2020-06-29 MED ORDER — FENTANYL CITRATE (PF) 250 MCG/5ML IJ SOLN
INTRAMUSCULAR | Status: AC
  Filled 2020-06-29: qty 5

## 2020-06-29 MED ORDER — METHOCARBAMOL 500 MG PO TABS
500.0000 mg | ORAL_TABLET | Freq: Four times a day (QID) | ORAL | Status: DC | PRN
  Filled 2020-06-29: qty 1

## 2020-06-29 MED ORDER — VANCOMYCIN HCL 1000 MG IV SOLR
INTRAVENOUS | Status: DC | PRN
  Administered 2020-06-29: 1000 mg

## 2020-06-29 MED ORDER — ROPIVACAINE HCL 7.5 MG/ML IJ SOLN
INTRAMUSCULAR | Status: DC | PRN
  Administered 2020-06-29: 20 mL via PERINEURAL

## 2020-06-29 MED ORDER — BUPIVACAINE LIPOSOME 1.3 % IJ SUSP
INTRAMUSCULAR | Status: AC
  Filled 2020-06-29: qty 20

## 2020-06-29 MED ORDER — MORPHINE SULFATE (PF) 4 MG/ML IV SOLN
INTRAVENOUS | Status: DC | PRN
  Administered 2020-06-29: 8 mg via INTRAVENOUS

## 2020-06-29 MED ORDER — PROPOFOL 500 MG/50ML IV EMUL
INTRAVENOUS | Status: DC | PRN
  Administered 2020-06-29: 75 ug/kg/min via INTRAVENOUS

## 2020-06-29 MED ORDER — MIDAZOLAM HCL 2 MG/2ML IJ SOLN
INTRAMUSCULAR | Status: AC
  Administered 2020-06-29: 2 mg via INTRAVENOUS
  Filled 2020-06-29: qty 2

## 2020-06-29 MED ORDER — PHENOL 1.4 % MT LIQD: 1.0000 | OROMUCOSAL | Status: DC | PRN

## 2020-06-29 MED ORDER — METHOCARBAMOL 1000 MG/10ML IJ SOLN
500.0000 mg | Freq: Four times a day (QID) | INTRAVENOUS | Status: DC | PRN
  Filled 2020-06-29: qty 5

## 2020-06-29 MED ORDER — SODIUM CHLORIDE (PF) 0.9 % IJ SOLN
INTRAMUSCULAR | Status: DC | PRN
  Administered 2020-06-29: 60 mL via INTRAVENOUS

## 2020-06-29 MED ORDER — ONDANSETRON HCL 4 MG/2ML IJ SOLN: 4.0000 mg | Freq: Four times a day (QID) | INTRAMUSCULAR | Status: DC | PRN

## 2020-06-29 MED ORDER — POVIDONE-IODINE 10 % EX SWAB: 2.0000 "application " | Freq: Once | CUTANEOUS | Status: DC

## 2020-06-29 MED ORDER — BUPIVACAINE HCL (PF) 0.25 % IJ SOLN
INTRAMUSCULAR | Status: AC
  Filled 2020-06-29: qty 30

## 2020-06-29 MED ORDER — LACTATED RINGERS IV SOLN: INTRAVENOUS | Status: DC | PRN

## 2020-06-29 MED ORDER — ASPIRIN 81 MG PO CHEW
81.0000 mg | CHEWABLE_TABLET | Freq: Two times a day (BID) | ORAL | Status: DC
  Administered 2020-06-29 – 2020-06-30 (×2): 81 mg via ORAL
  Filled 2020-06-29 (×2): qty 1

## 2020-06-29 MED ORDER — HYDROMORPHONE HCL 1 MG/ML IJ SOLN
0.5000 mg | INTRAMUSCULAR | Status: DC | PRN
  Administered 2020-06-29: 0.5 mg via INTRAVENOUS
  Filled 2020-06-29: qty 0.5

## 2020-06-29 MED ORDER — MORPHINE SULFATE (PF) 4 MG/ML IV SOLN
INTRAVENOUS | Status: AC
  Filled 2020-06-29: qty 2

## 2020-06-29 MED ORDER — ACETAMINOPHEN 500 MG PO TABS
1000.0000 mg | ORAL_TABLET | Freq: Once | ORAL | Status: AC
  Administered 2020-06-29: 1000 mg via ORAL
  Filled 2020-06-29: qty 2

## 2020-06-29 SURGICAL SUPPLY — 90 items
ALCOHOL 70% 16 OZ (MISCELLANEOUS) ×3 IMPLANT
BANDAGE ESMARK 6X9 LF (GAUZE/BANDAGES/DRESSINGS) ×1 IMPLANT
BLADE SAW SGTL 18X1.27X75 (BLADE) ×1 IMPLANT
BLADE SAW SGTL 18X1.27X75MM (BLADE) ×1
BNDG CMPR 9X6 STRL LF SNTH (GAUZE/BANDAGES/DRESSINGS) ×1
BNDG CMPR MED 15X6 ELC VLCR LF (GAUZE/BANDAGES/DRESSINGS) ×1
BNDG COHESIVE 6X5 TAN STRL LF (GAUZE/BANDAGES/DRESSINGS) ×3 IMPLANT
BNDG ELASTIC 4X5.8 VLCR STR LF (GAUZE/BANDAGES/DRESSINGS) ×3 IMPLANT
BNDG ELASTIC 6X15 VLCR STRL LF (GAUZE/BANDAGES/DRESSINGS) ×5 IMPLANT
BNDG ESMARK 6X9 LF (GAUZE/BANDAGES/DRESSINGS) ×3
BOWL SMART MIX CTS (DISPOSABLE) ×3 IMPLANT
CLOSURE WOUND 1/2 X4 (GAUZE/BANDAGES/DRESSINGS) ×1
COMP FEM KAHUNA CE 11.5 LT (Joint) ×3 IMPLANT
COMPONENT FEM KAHUNA CE 11.5LT (Joint) IMPLANT
CONT SPEC 4OZ CLIKSEAL STRL BL (MISCELLANEOUS) ×6 IMPLANT
COVER SURGICAL LIGHT HANDLE (MISCELLANEOUS) ×5 IMPLANT
COVER WAND RF STERILE (DRAPES) ×3 IMPLANT
CUFF TOURN SGL QUICK 34 (TOURNIQUET CUFF) ×3
CUFF TOURN SGL QUICK 42 (TOURNIQUET CUFF) IMPLANT
CUFF TRNQT CYL 34X4.125X (TOURNIQUET CUFF) ×1 IMPLANT
DECANTER SPIKE VIAL GLASS SM (MISCELLANEOUS) ×2 IMPLANT
DRAPE HALF SHEET 40X57 (DRAPES) ×2 IMPLANT
DRAPE IMP U-DRAPE 54X76 (DRAPES) ×3 IMPLANT
DRAPE INCISE IOBAN 66X45 STRL (DRAPES) IMPLANT
DRAPE ORTHO SPLIT 77X108 STRL (DRAPES) ×9
DRAPE SURG ORHT 6 SPLT 77X108 (DRAPES) ×3 IMPLANT
DRAPE U-SHAPE 47X51 STRL (DRAPES) ×3 IMPLANT
DRSG AQUACEL AG ADV 3.5X10 (GAUZE/BANDAGES/DRESSINGS) ×2 IMPLANT
DRSG PAD ABDOMINAL 8X10 ST (GAUZE/BANDAGES/DRESSINGS) ×6 IMPLANT
DURAPREP 26ML APPLICATOR (WOUND CARE) ×3 IMPLANT
ELECT REM PT RETURN 9FT ADLT (ELECTROSURGICAL) ×3
ELECTRODE REM PT RTRN 9FT ADLT (ELECTROSURGICAL) ×1 IMPLANT
FACESHIELD WRAPAROUND (MASK) ×3 IMPLANT
FACESHIELD WRAPAROUND OR TEAM (MASK) ×1 IMPLANT
GAUZE SPONGE 4X4 12PLY STRL (GAUZE/BANDAGES/DRESSINGS) ×3 IMPLANT
GAUZE XEROFORM 5X9 LF (GAUZE/BANDAGES/DRESSINGS) ×3 IMPLANT
GLOVE SRG 8 PF TXTR STRL LF DI (GLOVE) ×1 IMPLANT
GLOVE SURG LTX SZ8 (GLOVE) ×3 IMPLANT
GLOVE SURG UNDER POLY LF SZ8 (GLOVE) ×3
GOWN STRL REUS W/ TWL LRG LVL3 (GOWN DISPOSABLE) ×3 IMPLANT
GOWN STRL REUS W/ TWL XL LVL3 (GOWN DISPOSABLE) ×1 IMPLANT
GOWN STRL REUS W/TWL LRG LVL3 (GOWN DISPOSABLE) ×9
GOWN STRL REUS W/TWL XL LVL3 (GOWN DISPOSABLE) ×3
HANDPIECE INTERPULSE COAX TIP (DISPOSABLE) ×3
HOOD PEEL AWAY FACE SHEILD DIS (HOOD) ×9 IMPLANT
IMMOBILIZER KNEE 20 (SOFTGOODS)
IMMOBILIZER KNEE 20 THIGH 36 (SOFTGOODS) IMPLANT
IMMOBILIZER KNEE 22 UNIV (SOFTGOODS) IMPLANT
IMMOBILIZER KNEE 24 THIGH 36 (MISCELLANEOUS) IMPLANT
IMMOBILIZER KNEE 24 UNIV (MISCELLANEOUS)
KIT BASIN OR (CUSTOM PROCEDURE TRAY) ×3 IMPLANT
KIT PIN (KITS) ×2 IMPLANT
KIT TURNOVER KIT B (KITS) ×3 IMPLANT
KNEE PATELLA ASYMMETRIC 9X29 (Knees) ×2 IMPLANT
MANIFOLD NEPTUNE II (INSTRUMENTS) ×3 IMPLANT
NDL 18GX1X1/2 (RX/OR ONLY) (NEEDLE) ×1 IMPLANT
NDL SPNL 18GX3.5 QUINCKE PK (NEEDLE) ×1 IMPLANT
NEEDLE 18GX1X1/2 (RX/OR ONLY) (NEEDLE) ×3 IMPLANT
NEEDLE HYPO 22GX1.5 SAFETY (NEEDLE) ×6 IMPLANT
NEEDLE SPNL 18GX3.5 QUINCKE PK (NEEDLE) ×3 IMPLANT
NS IRRIG 1000ML POUR BTL (IV SOLUTION) ×6 IMPLANT
PACK TOTAL JOINT (CUSTOM PROCEDURE TRAY) ×3 IMPLANT
PACK UNIVERSAL I (CUSTOM PROCEDURE TRAY) ×3 IMPLANT
PAD ARMBOARD 7.5X6 YLW CONV (MISCELLANEOUS) ×6 IMPLANT
PAD CAST 4YDX4 CTTN HI CHSV (CAST SUPPLIES) ×1 IMPLANT
PADDING CAST COTTON 4X4 STRL (CAST SUPPLIES) ×3
PADDING CAST COTTON 6X4 STRL (CAST SUPPLIES) ×5 IMPLANT
POST TAPER 11MM (Orthopedic Implant) ×2 IMPLANT
SET HNDPC FAN SPRY TIP SCT (DISPOSABLE) ×1 IMPLANT
SOL PREP POV-IOD 4OZ 10% (MISCELLANEOUS) ×3 IMPLANT
SPONGE LAP 18X18 RF (DISPOSABLE) IMPLANT
STAPLER VISISTAT 35W (STAPLE) IMPLANT
STRIP CLOSURE SKIN 1/2X4 (GAUZE/BANDAGES/DRESSINGS) ×1 IMPLANT
SUCTION FRAZIER HANDLE 10FR (MISCELLANEOUS) ×3
SUCTION TUBE FRAZIER 10FR DISP (MISCELLANEOUS) ×1 IMPLANT
SUT MNCRL AB 3-0 PS2 18 (SUTURE) ×2 IMPLANT
SUT VIC AB 0 CT1 27 (SUTURE) ×12
SUT VIC AB 0 CT1 27XBRD ANBCTR (SUTURE) ×3 IMPLANT
SUT VIC AB 1 CT1 27 (SUTURE) ×18
SUT VIC AB 1 CT1 27XBRD ANBCTR (SUTURE) ×5 IMPLANT
SUT VIC AB 2-0 CT1 27 (SUTURE) ×6
SUT VIC AB 2-0 CT1 TAPERPNT 27 (SUTURE) ×2 IMPLANT
SYR 20ML ECCENTRIC (SYRINGE) ×2 IMPLANT
SYR 20ML LL LF (SYRINGE) ×2 IMPLANT
SYR 30ML LL (SYRINGE) ×5 IMPLANT
SYR TB 1ML LUER SLIP (SYRINGE) ×3 IMPLANT
TOWEL GREEN STERILE (TOWEL DISPOSABLE) ×6 IMPLANT
TOWEL GREEN STERILE FF (TOWEL DISPOSABLE) ×6 IMPLANT
TRAY FOLEY MTR SLVR 16FR STAT (SET/KITS/TRAYS/PACK) IMPLANT
WATER STERILE IRR 1000ML POUR (IV SOLUTION) ×6 IMPLANT

## 2020-06-29 NOTE — Anesthesia Procedure Notes (Signed)
Anesthesia Regional Block: Adductor canal block   Pre-Anesthetic Checklist: , timeout performed,  Correct Patient, Correct Site, Correct Laterality,  Correct Procedure, Correct Position, site marked,  Risks and benefits discussed,  Surgical consent,  Pre-op evaluation,  At surgeon's request and post-op pain management  Laterality: Left  Prep: chloraprep       Needles:  Injection technique: Single-shot  Needle Type: Echogenic Needle     Needle Length: 9cm  Needle Gauge: 21     Additional Needles:   Procedures:,,,, ultrasound used (permanent image in chart),,    Narrative:  Start time: 06/29/2020 10:48 AM End time: 06/29/2020 10:58 AM Injection made incrementally with aspirations every 5 mL.  Performed by: Personally  Anesthesiologist: Catalina Gravel, MD  Additional Notes: No pain on injection. No increased resistance to injection. Injection made in 5cc increments.  Good needle visualization.  Patient tolerated procedure well.

## 2020-06-29 NOTE — Transfer of Care (Signed)
2Immediate Anesthesia Transfer of Care Note  Patient: HELLON VACCARELLA  Procedure(s) Performed: LEFT PATELLA-FEMORAL ARTHROPLASTY (Left: Knee)  Patient Location: PACU  Anesthesia Type:Spinal  Level of Consciousness: awake and alert   Airway & Oxygen T22herapy: Patient Spontanous Breathing  Post-op Assessment: Report given to RN and Post -op Vital signs reviewed and stable  Post vital signs: Reviewed and stable  Last Vitals:  Vitals Value Taken Time  BP 120/104 06/29/20 1457  Temp 36.1 C 06/29/20 1455  Pulse 85 06/29/20 1457  Resp 18 06/29/20 1457  SpO2 97 % 06/29/20 1457  Vitals shown include unvalidated device data.  Last Pain:  Vitals:   06/29/20 1125  TempSrc:   PainSc: 0-No pain      Patients Stated Pain Goal: 4 (01/60/10 9323)  Complications: No notable events documented.

## 2020-06-29 NOTE — Anesthesia Preprocedure Evaluation (Signed)
Anesthesia Evaluation  Patient identified by MRN, date of birth, ID band Patient awake    Reviewed: Allergy & Precautions, NPO status , Patient's Chart, lab work & pertinent test results  Airway Mallampati: II  TM Distance: >3 FB Neck ROM: Full    Dental  (+) Teeth Intact, Dental Advisory Given   Pulmonary former smoker,    Pulmonary exam normal breath sounds clear to auscultation       Cardiovascular hypertension, Pt. on medications Normal cardiovascular exam Rhythm:Regular Rate:Normal     Neuro/Psych  Headaches, PSYCHIATRIC DISORDERS Anxiety Depression    GI/Hepatic negative GI ROS, Neg liver ROS,   Endo/Other  Obesity   Renal/GU negative Renal ROS     Musculoskeletal  (+) Arthritis ,   Abdominal   Peds  Hematology negative hematology ROS (+) Plt 398k   Anesthesia Other Findings Day of surgery medications reviewed with the patient.  Reproductive/Obstetrics                             Anesthesia Physical Anesthesia Plan  ASA: 2  Anesthesia Plan: Spinal   Post-op Pain Management:  Regional for Post-op pain   Induction: Intravenous  PONV Risk Score and Plan: 2 and Propofol infusion and Midazolam  Airway Management Planned: Natural Airway and Nasal Cannula  Additional Equipment:   Intra-op Plan:   Post-operative Plan:   Informed Consent: I have reviewed the patients History and Physical, chart, labs and discussed the procedure including the risks, benefits and alternatives for the proposed anesthesia with the patient or authorized representative who has indicated his/her understanding and acceptance.     Dental advisory given  Plan Discussed with: CRNA  Anesthesia Plan Comments:         Anesthesia Quick Evaluation

## 2020-06-29 NOTE — H&P (Signed)
Nicole Deleon is an 40 y.o. female.   Chief Complaint: left knee pain HPI: Nicole Deleon is a 40 y.o. female who presents  complaining of left knee pain.  Patient returns to discuss MRI results of left knee.  Patient complains of continued left knee pain since about 2014.  She has no history of surgery but she does describe 1 prior patellar dislocation that was 2 years ago with no recurrence following that.  Localizes most of her pain to the anterior aspect of the left knee with occasional radiation down into the shin.  No significant groin pain or radicular pain.  She is currently using knee brace.  Knee gives out on her on occasion and she has to use a cane at times.  She is currently a Freight forwarder at [employer] and this causes her to miss work and reduce her hours because of the pain.  She wakes with pain at night.  She has had previous cortisone and gel injections over the last 6 years.  Gel injections lasted couple months as well as a cortisone injections but she has had no relief with the past several injections.  Going up stairs causes her more pain.  Downstairs is not as bad.  Denies any history of diabetes, DVT, PE.  Her job involves standing for long periods of time.  No significant medical history aside from hypertension for which she is on low-dose medications..    Past Medical History:  Diagnosis Date   Allergy    Anxiety    Arthritis    left knee   Chronic back pain    Depression    Headache    Hypertension    Pre-diabetes    diet controlled   SVD (spontaneous vaginal delivery)    x 3    Past Surgical History:  Procedure Laterality Date   no previous surgeries     TUBAL LIGATION      Family History  Problem Relation Age of Onset   Hypertension Mother    Hypertension Father    Hyperlipidemia Father    Social History:  reports that she quit smoking about 4 months ago. Her smoking use included cigarettes. She has a 6.00 pack-year smoking history. She has never used  smokeless tobacco. She reports current alcohol use of about 3.0 standard drinks of alcohol per week. She reports previous drug use. Drug: Marijuana.  Allergies: No Known Allergies  Medications Prior to Admission  Medication Sig Dispense Refill   acetaminophen (TYLENOL) 500 MG tablet Take 500 mg by mouth 3 (three) times daily as needed for moderate pain.     buPROPion (WELLBUTRIN XL) 150 MG 24 hr tablet Take 1 tablet (150 mg total) by mouth daily. 30 tablet 1   hydrochlorothiazide (HYDRODIURIL) 25 MG tablet Take 1 tablet (25 mg total) by mouth daily. 30 tablet 2   Multiple Vitamin (MULTIVITAMIN WITH MINERALS) TABS tablet Take 1 tablet by mouth daily.     sulfamethoxazole-trimethoprim (BACTRIM DS) 800-160 MG tablet 1 p.o. twice daily x 5 days 10 tablet 0    Results for orders placed or performed during the hospital encounter of 06/29/20 (from the past 48 hour(s))  Glucose, capillary     Status: Abnormal   Collection Time: 06/29/20 10:09 AM  Result Value Ref Range   Glucose-Capillary 107 (H) 70 - 99 mg/dL    Comment: Glucose reference range applies only to samples taken after fasting for at least 8 hours.  Pregnancy, urine POC  Status: None   Collection Time: 06/29/20 10:43 AM  Result Value Ref Range   Preg Test, Ur NEGATIVE NEGATIVE    Comment:        THE SENSITIVITY OF THIS METHODOLOGY IS >24 mIU/mL    No results found.  Review of Systems  Musculoskeletal:  Positive for arthralgias.  All other systems reviewed and are negative.  Blood pressure 112/74, pulse 78, temperature 98.4 F (36.9 C), temperature source Oral, resp. rate 12, height 5\' 11"  (1.803 m), weight 128 kg, last menstrual period 06/09/2020, SpO2 99 %. Physical Exam Vitals reviewed.  HENT:     Head: Normocephalic.     Nose: Nose normal.     Mouth/Throat:     Mouth: Mucous membranes are moist.  Eyes:     Pupils: Pupils are equal, round, and reactive to light.  Cardiovascular:     Rate and Rhythm: Normal rate.   Pulmonary:     Effort: Pulmonary effort is normal.  Abdominal:     General: Abdomen is flat.  Musculoskeletal:     Cervical back: Normal range of motion.  Skin:    Capillary Refill: Capillary refill takes less than 2 seconds.  Neurological:     General: No focal deficit present.     Mental Status: She is alert.  Psychiatric:        Mood and Affect: Mood normal.    Ortho exam demonstrates left knee with no effusion.  She does have crepitus in the patellofemoral joint with side to side motion of the patella as well as with passive flexion and extension of the knee.  No calf tenderness.  She is able to extend the leg and perform straight leg raise without difficulty.  She has tenderness over the medial and lateral joint line diffusely with no focal area of tenderness.  Positive patellar grind test.  No pain with hip range of motion.  She has negative patellar apprehension . Assessment/Plan Patient is a 40 year old female who presents complaining of left knee pain.  She has long history of left knee pain with severe patellofemoral arthritis.  She had MRI of the left knee for evaluation of the articular cartilage.  Medial and lateral compartments are well preserved but there is severe cartilage loss along the lateral patellar facet of the patellofemoral compartment.  No other findings noted on MRI aside from a cyst that is medial to the medial meniscus but there is no evidence of meniscal injury.  Discussed options available to patient.  She has had no relief from injections recently and she does not want to try physical therapy as she is afraid this will make her pain worse.  Discussed patellofemoral arthroplasty as a option.  Discussed the risks and benefits of the procedure including knee stiffness, patellar instability, need for revision surgery, prosthetic joint infection, medical complication from surgery including DVT, pulmonary embolism, heart issues..  After lengthy discussion, patient would  like to proceed with surgery.  She understands that there is a high risk that she will need this procedure revised in the future given her young age.  She also understands she will not be able to kneel on her knee after this procedure.  Plan to post patient for surgery and follow-up after procedure.  In general we do not have a great option for this particular problem in terms of cartilage restoration.  The rest of her knee looks pretty reasonable on MRI scan.  She does have a very small meniscal cyst on that medial  side but no discrete tear and no real significant symptoms present in this location.  Most of her pain localizes anteriorly.  Tibial tubercle trochlear groove distance not excessive and her alignment also is not unfavorable in terms of patellar instability.    Anderson Malta, MD 06/29/2020, 11:16 AM

## 2020-06-29 NOTE — Brief Op Note (Signed)
   06/29/2020  2:57 PM  PATIENT:  Bethann Humble  40 y.o. female  PRE-OPERATIVE DIAGNOSIS:  left patello femoral arthritis  POST-OPERATIVE DIAGNOSIS:  left patello femoral arthritis  PROCEDURE:  Procedure(s): LEFT PATELLA-FEMORAL ARTHROPLASTY  SURGEON:  Surgeon(s): Meredith Pel, MD  ASSISTANT: magnant pa  ANESTHESIA:   spinal  EBL: 10 ml    Total I/O In: 1700 [I.V.:1700] Out: 405 [Urine:400; Blood:5]  BLOOD ADMINISTERED: none  DRAINS: none   LOCAL MEDICATIONS USED:  marcaine mso4 clonidine vanco exparel  SPECIMEN:  No Specimen  COUNTS:  YES  TOURNIQUET:   Total Tourniquet Time Documented: Thigh (Left) - 61 minutes Total: Thigh (Left) - 61 minutes   DICTATION: .Other Dictation: Dictation Number [id number]  PLAN OF CARE: Admit for overnight observation  PATIENT DISPOSITION:  PACU - hemodynamically stable

## 2020-06-29 NOTE — Progress Notes (Signed)
Orthopedic Tech Progress Note Patient Details:  GLYNNA FAILLA 1980-12-25 [id number]  CPM Left Knee Left Knee Flexion (Degrees): 10 Left Knee Extension (Degrees): 40  Post Interventions Patient Tolerated: Well Ortho Devices Type of Ortho Device: Bone foam zero knee Ortho Device/Splint Location: LLE Ortho Device/Splint Interventions: Application, Adjustment, Ordered   Post Interventions Patient Tolerated: Well  Chip Boer 06/29/2020, 3:33 PM

## 2020-06-29 NOTE — Op Note (Signed)
NAME: Nicole Deleon, PADGETT MEDICAL RECORD NO: [id number] ACCOUNT NO: [id number] DATE OF BIRTH: 10-Aug-1980 FACILITY: [facility] LOCATION: [id number] PHYSICIAN: Yetta Barre. Marlou Sa, MD  Operative Report   DATE OF PROCEDURE: 06/29/2020  PREOPERATIVE DIAGNOSIS:  Left knee patellofemoral arthritis.  POSTOPERATIVE DIAGNOSIS:  Left knee patellofemoral arthritis.  PROCEDURE:  Left knee patellofemoral replacement using Arthrocare patellofemoral Wave replacement size 11.5 x 5 with Stryker press-fit 29 mm patella.  SURGEON ATTENDING:  Yetta Barre. Marlou Sa, MD  ASSISTANT:  Annie Main.  INDICATIONS:  The patient is a 40 year old patient with left knee pain refractory to nonoperative management, who presents for operative management after explanation of risks and benefits.  DESCRIPTION OF PROCEDURE:  The patient was brought to the operating room where spinal anesthetic was induced.  Preoperative antibiotics were administered.  Timeout was called.  Left leg pre-scrubbed with alcohol and Betadine allowed to air dry.  Prepped  with DuraPrep solution and draped in sterile manner.  Ioban used to cover the operative field.  The patient did not have patellar instability preoperatively with about 1.5 mm of lateral translation with firm endpoint at both 0 and 30 degrees of flexion.   Collateral and cruciate ligaments were stable.  Following sterile prepping and draping, timeout was called.  Leg was elevated and exsanguinated with Esmarch wrap.  Total tourniquet time 60 minutes at 300 mmHg.  Anterior approach to the knee was made  after placing the knee on a bolster.  IrriSept solution used to irrigate the incision as well as at multiple times during the case.  Median parapatellar arthrotomy was made and marked with a #1 Vicryl suture.  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  Grade 2 changes present in the medial aspect of the trochlea.  The remaining joint surfaces were intact.  ACL  and PCL intact.  At this time, the sizing apparatus was placed.  A guide pin was placed.  Next, the  proximal reaming was performed down to the size 4-5 mark based on the articular cartilage.  Next, the secondary jig was placed, which corresponded to size 11.5.  There was no rocking in the anterior, posterior plane with the size 11 jig.  This was  pinned into position.  Two reams were then performed first with the cutting reamer and then with the drilling reamer.  This was removed.  Trial components of size 10 and 11.5 were placed and the 11.5 was the best fit for the patient's anatomy.  Next, a  guide pin was placed proximally for the size 5 reamer to recess the proximal portion of the implant.  At this time, thorough irrigation was performed.  The size 11.5 was then placed and very nice recessed implant was visualized.  At this time, the pilot  hole was carefully drilled followed by tapping.  The thorough irrigation was performed and the screw was placed with excellent purchase obtained.  Next, attention was directed towards the patella.  The patella was cut down from a 22 down to 12 mm.  A  trial patellar button was placed, which was 29 mm.  Next, the actual implant, which was size 11.5 was placed and tapped into position with excellent symmetry and appropriate recession present.  At this time, very good press fit was achieved.  The patella  tracked well with no thumbs technique.  Tourniquet was released.  Thorough irrigation was performed.  The capsule was anesthetized using combination of Marcaine, saline, Exparel.  Thorough  irrigation performed.  The arthrotomy was then closed over bolster using #1 Vicryl suture followed by  interrupted inverted 0 Vicryl suture.  IrriSept solution also utilized along with vancomycin powder after closing the arthrotomy.  A solution of Marcaine, morphine, clonidine also injected into the knee joint after closing the arthrotomy.  Remaining  closure was performed using 0  Vicryl suture, 2-0 Vicryl suture, and 3-0 Monocryl.  Steri-Strips and Aquacel dressing, bulky dressing, iceman and knee immobilizer were placed.  The patient tolerated the procedure well without immediate complication and  transferred to the recovery room in stable condition.  Luke's assistance was required for mobilization of tissues, opening, closing, release of the lateral patellofemoral ligament implantation.  His assistance was a medical necessity.   PUS D: 06/29/2020 3:04:39 pm T: 06/29/2020 4:51:00 pm  JOB: [id number]/ [id number]

## 2020-06-29 NOTE — Anesthesia Procedure Notes (Signed)
Spinal  Patient location during procedure: OR Start time: 06/29/2020 12:27 PM End time: 06/29/2020 12:30 PM Reason for block: surgical anesthesia Staffing Performed: anesthesiologist  Anesthesiologist: Catalina Gravel, MD Preanesthetic Checklist Completed: patient identified, IV checked, risks and benefits discussed, surgical consent, monitors and equipment checked, pre-op evaluation and timeout performed Spinal Block Patient position: sitting Prep: DuraPrep and site prepped and draped Patient monitoring: continuous pulse ox and blood pressure Approach: midline Location: L3-4 Injection technique: single-shot Needle Needle type: Pencan  Needle gauge: 24 G Assessment Sensory level: T6 Events: CSF return Additional Notes Functioning IV was confirmed and monitors were applied. Sterile prep and drape, including hand hygiene, mask and sterile gloves were used. The patient was positioned and the spine was prepped. The skin was anesthetized with lidocaine.  Free flow of clear CSF was obtained prior to injecting local anesthetic into the CSF.  The spinal needle aspirated freely following injection.  The needle was carefully withdrawn.  The patient tolerated the procedure well. Consent was obtained prior to procedure with all questions answered and concerns addressed. Risks including but not limited to bleeding, infection, nerve damage, paralysis, failed block, inadequate analgesia, allergic reaction, high spinal, itching and headache were discussed and the patient wished to proceed.   Hoy Morn, MD

## 2020-06-29 NOTE — Plan of Care (Signed)
  Problem: Education: Goal: Knowledge of General Education information will improve Description Including pain rating scale, medication(s)/side effects and non-pharmacologic comfort measures Outcome: Progressing   Problem: Health Behavior/Discharge Planning: Goal: Ability to manage health-related needs will improve Outcome: Progressing   

## 2020-06-30 MED ORDER — DOCUSATE SODIUM 100 MG PO CAPS: 100.0000 mg | ORAL_CAPSULE | Freq: Two times a day (BID) | ORAL | 0 refills | Status: DC

## 2020-06-30 MED ORDER — ASPIRIN 81 MG PO CHEW: 81.0000 mg | CHEWABLE_TABLET | Freq: Every day | ORAL | 0 refills | Status: DC

## 2020-06-30 MED ORDER — METHOCARBAMOL 500 MG PO TABS: 500.0000 mg | ORAL_TABLET | Freq: Three times a day (TID) | ORAL | 0 refills | Status: DC | PRN

## 2020-06-30 MED ORDER — KETOROLAC TROMETHAMINE 10 MG PO TABS: 10.0000 mg | ORAL_TABLET | Freq: Three times a day (TID) | ORAL | 0 refills | Status: DC | PRN

## 2020-06-30 MED ORDER — OXYCODONE HCL 5 MG PO TABS: 5.0000 mg | ORAL_TABLET | ORAL | 0 refills | Status: DC | PRN

## 2020-06-30 NOTE — Plan of Care (Signed)
  Problem: Health Behavior/Discharge Planning: Goal: Ability to manage health-related needs will improve Outcome: Progressing   Problem: Activity: Goal: Risk for activity intolerance will decrease Outcome: Progressing   Problem: Pain Managment: Goal: General experience of comfort will improve Outcome: Progressing   

## 2020-06-30 NOTE — Progress Notes (Signed)
  Subjective: Nicole Deleon is a 40 y.o. female s/p left patellofemoral arthroplasty.  They are POD1.  Pt's pain is controlled.  Pt denies numbness/tingling/weakness.  Pt has ambulated with some difficulty to her chair. Has not ambulated significant distance with PT yet due to PT not being sure of her mobility restrictions  Objective: Vital signs in last 24 hours: Temp:  [97 F (36.1 C)-98.4 F (36.9 C)] 98.4 F (36.9 C) (06/24 0513) Pulse Rate:  [75-100] 100 (06/24 0926) Resp:  [10-19] 17 (06/24 0926) BP: (98-134)/(52-94) 119/75 (06/24 0926) SpO2:  [96 %-100 %] 100 % (06/24 0926)  Intake/Output from previous day: 06/23 0701 - 06/24 0700 In: 1700 [I.V.:1700] Out: 405 [Urine:400; Blood:5] Intake/Output this shift: No intake/output data recorded.  Exam:  No gross blood or drainage overlying the dressing 2+ DP pulse Sensation intact distally in the left foot Able to dorsiflex and plantarflex the left foot   Labs: No results for input(s): HGB in the last 72 hours. No results for input(s): WBC, RBC, HCT, PLT in the last 72 hours. No results for input(s): NA, K, CL, CO2, BUN, CREATININE, GLUCOSE, CALCIUM in the last 72 hours. No results for input(s): LABPT, INR in the last 72 hours.  Assessment/Plan: Pt is POD1 s/p left TKA.    -Plan to discharge to home today or tomorrow pending patient's pain and PT eval  -WBAT with a walker  -Use the CPM machine at least 3 times per day for one hour each time, increasing the degrees daily.     Kyleigha Markert L Millee Denise 06/30/2020, 11:20 AM

## 2020-06-30 NOTE — Plan of Care (Signed)

## 2020-06-30 NOTE — Progress Notes (Signed)
PT Cancellation Note  Patient Details Name: Nicole Deleon MRN: [id number] DOB: 1980-05-01   Cancelled Treatment:    Reason Eval/Treat Not Completed: Other (comment).  Awaiting mobility instructions from MD and will progress to gait.   Ramond Dial 06/30/2020, 10:08 AM  Mee Hives, PT MS Acute Rehab Dept. Number: [place] [facility] [place]

## 2020-06-30 NOTE — Anesthesia Postprocedure Evaluation (Signed)
Anesthesia Post Note  Patient: Nicole Deleon  Procedure(s) Performed: LEFT PATELLA-FEMORAL ARTHROPLASTY (Left: Knee)     Patient location during evaluation: PACU Anesthesia Type: Spinal Level of consciousness: oriented, awake and alert and awake Pain management: pain level controlled Vital Signs Assessment: post-procedure vital signs reviewed and stable Respiratory status: spontaneous breathing, respiratory function stable and patient connected to nasal cannula oxygen Cardiovascular status: blood pressure returned to baseline and stable Postop Assessment: no headache, no backache, no apparent nausea or vomiting, spinal receding and patient able to bend at knees Anesthetic complications: no   No notable events documented.  Last Vitals:  Vitals:   06/30/20 0513 06/30/20 0926  BP: (!) 116/52 119/75  Pulse: 93 100  Resp: 17 17  Temp: 36.9 C   SpO2: 100% 100%    Last Pain:  Vitals:   06/30/20 1347  TempSrc:   PainSc: 7                  Catalina Gravel

## 2020-06-30 NOTE — Evaluation (Addendum)
Physical Therapy Evaluation Patient Details Name: Nicole Deleon MRN: [id number] DOB: 1980-07-08 Today's Date: 06/30/2020   History of Present Illness  40 yo female with onset of L knee pain from significant time has been admitted and has L patellofemoral arthroplasty with no movement limitations per MD.  Chesley Noon to walk, referred to PT for evaluation.  PMHx:  prediabetic, previous L knee patellar dislocation, depression, anxiety, LBP, HA, HTN, L knee OA  Clinical Impression  Pt was seen for mobility on RW with instruction for protection of L knee to stand and sit.  Pt is demonstrating return on the instruction, and was able to review the ROM to L knee with holding gentle stretch with PT.  Pt was cautioned not to overdo walking or exercise, as she wants to gradually increase use of L knee and avoid creating a painful overuse of the knee.  After reviewing step training, put ice on L knee with [facility], and talked with pt about the benefits of ice.  Pt is going home with family help, has an accessible home and already has the equipment needed.  Recommended PT but per dc planning team may not have access to it.  Follow acutely for goals of therapy as her stay permits.    Follow Up Recommendations Home health PT (if available to pt)    Equipment Recommendations  None recommended by PT (Family has walker and 3 in one per pt)    Recommendations for Other Services       Precautions / Restrictions Precautions Precautions: Knee Precaution Booklet Issued: Yes (comment) Precaution Comments: instructed on ROM to L knee, permitted any type of ex including ROM and strengthening Restrictions Weight Bearing Restrictions: Yes LLE Weight Bearing: Weight bearing as tolerated      Mobility  Bed Mobility               General bed mobility comments: up in chair when PT arrived    Transfers Overall transfer level: Needs assistance Equipment used: Rolling walker (2 wheeled);1 person hand held  assist Transfers: Sit to/from Stand Sit to Stand: Min guard;Min assist         General transfer comment: initially more help then was able to stand with min guard for safety  Ambulation/Gait Ambulation/Gait assistance: Min guard Gait Distance (Feet): 45 Feet Assistive device: Rolling walker (2 wheeled) Gait Pattern/deviations: Step-to pattern;Step-through pattern;Decreased stride length;Decreased stance time - left;Decreased step length - right;Decreased dorsiflexion - left;Decreased weight shift to left;Wide base of support Gait velocity: reduced Gait velocity interpretation: <1.31 ft/sec, indicative of household ambulator General Gait Details: walking on RW with help, cued for safety and use of walker to really unload L knee  Stairs            Wheelchair Mobility    Modified Rankin (Stroke Patients Only)       Balance Overall balance assessment: Needs assistance Sitting-balance support: Feet supported Sitting balance-Leahy Scale: Good     Standing balance support: Bilateral upper extremity supported Standing balance-Leahy Scale: Poor Standing balance comment: requires walker for stability                             Pertinent Vitals/Pain Pain Assessment: 0-10 Pain Score: 5  Pain Location: L knee surgery Pain Descriptors / Indicators: Grimacing;Guarding;Sore;Tightness Pain Intervention(s): Premedicated before session;Limited activity within patient's tolerance;Monitored during session;Repositioned;Ice applied    Home Living Family/patient expects to be discharged to:: Private residence Living Arrangements:  Other relatives Available Help at Discharge: Family;Available 24 hours/day Type of Home: House Home Access: Ramped entrance;Stairs to enter Entrance Stairs-Rails: None Entrance Stairs-Number of Steps: 1 (threshold) Home Layout: One level Home Equipment: [place] - 2 wheels;Bedside commode;Shower seat;Cane - single point Additional Comments:  family has equipment    Prior Function Level of Independence: Independent with assistive device(s)         Comments: used SPC previously     Hand Dominance   Dominant Hand: Right    Extremity/Trunk Assessment   Upper Extremity Assessment Upper Extremity Assessment: Overall WFL for tasks assessed    Lower Extremity Assessment Lower Extremity Assessment: LLE deficits/detail LLE Deficits / Details: new patellofemoral repair LLE Coordination: decreased gross motor    Cervical / Trunk Assessment Cervical / Trunk Assessment: Other exceptions (history of LBP)  Communication   Communication: No difficulties  Cognition Arousal/Alertness: Awake/alert Behavior During Therapy: WFL for tasks assessed/performed Overall Cognitive Status: Within Functional Limits for tasks assessed                                        General Comments General comments (skin integrity, edema, etc.): Pt in 5/10 pain at rest, 8.5 pain wiht gait per her report    Exercises General Exercises - Lower Extremity Ankle Circles/Pumps: AROM;5 reps Short Arc Quad: AAROM;5 reps;Left Heel Slides: AAROM;Left;5 reps   Assessment/Plan    PT Assessment Patient needs continued PT services  PT Problem List Decreased strength;Decreased range of motion;Decreased activity tolerance;Decreased balance;Decreased mobility;Decreased knowledge of use of DME;Decreased skin integrity;Pain       PT Treatment Interventions DME instruction;Gait training;Stair training;Functional mobility training;Therapeutic activities;Therapeutic exercise;Balance training;Neuromuscular re-education;Patient/family education    PT Goals (Current goals can be found in the Care Plan section)  Acute Rehab PT Goals Patient Stated Goal: to walk and go home PT Goal Formulation: With patient Time For Goal Achievement: 07/03/20 Potential to Achieve Goals: Good    Frequency 7X/week   Barriers to discharge   home with family  and in accessible set up    Co-evaluation               AM-PAC PT "6 Clicks" Mobility  Outcome Measure Help needed turning from your back to your side while in a flat bed without using bedrails?: None Help needed moving from lying on your back to sitting on the side of a flat bed without using bedrails?: A Little Help needed moving to and from a bed to a chair (including a wheelchair)?: A Little Help needed standing up from a chair using your arms (e.g., wheelchair or bedside chair)?: A Little Help needed to walk in hospital room?: A Little Help needed climbing 3-5 steps with a railing? : A Little 6 Click Score: 19    End of Session Equipment Utilized During Treatment: Gait belt Activity Tolerance: Patient limited by fatigue;Patient limited by pain Patient left: in chair;with call bell/phone within reach Nurse Communication: Mobility status PT Visit Diagnosis: Unsteadiness on feet (R26.81);Muscle weakness (generalized) (M62.81);Pain Pain - Right/Left: Left Pain - part of body: Knee    Time: 1139-1212 PT Time Calculation (min) (ACUTE ONLY): 33 min   Charges:   PT Evaluation $PT Eval Moderate Complexity: 1 Mod PT Treatments $Gait Training: 8-22 mins       Ramond Dial 06/30/2020, 1:43 PM  Mee Hives, PT MS Acute Rehab Dept. Number: [facility] [phone] and [facility]  [phone]   

## 2020-07-03 ENCOUNTER — Encounter (HOSPITAL_COMMUNITY): Payer: Self-pay | Admitting: Orthopedic Surgery

## 2020-07-04 ENCOUNTER — Encounter (HOSPITAL_COMMUNITY): Payer: Self-pay | Admitting: Orthopedic Surgery

## 2020-07-05 NOTE — Discharge Summary (Signed)
Physician Discharge Summary      Patient ID: Nicole Deleon MRN: [id number] DOB/AGE: 1980/06/25 40 y.o.  Admit date: 06/29/2020 Discharge date: 06/30/2020  Admission Diagnoses:  Active Problems:   Patellofemoral arthritis of left knee   Status post left partial knee replacement   Discharge Diagnoses:  Same  Surgeries: Procedure(s): LEFT PATELLA-FEMORAL ARTHROPLASTY on 06/29/2020   Consultants:   Discharged Condition: Stable  Hospital Course: Nicole Deleon is an 40 y.o. female who was admitted 06/29/2020 with a chief complaint of left knee pain, and found to have a diagnosis of left knee patellofemoral osteoarthritis.  They were brought to the operating room on 06/29/2020 and underwent the above named procedures.  Pt awoke from anesthesia without complication and was transferred to the floor. On POD1, patient's pain was controlled and she was able to mobilize well with therapy.  She felt comfortable with discharge home.  She was discharged home on POD 1..  Pt will f/u with Dr. Marlou Sa in clinic in ~2 weeks.   Antibiotics given:  Anti-infectives (From admission, onward)    Start     Dose/Rate Route Frequency Ordered Stop   06/29/20 2000  ceFAZolin (ANCEF) IVPB 2g/100 mL premix        2 g 200 mL/hr over 30 Minutes Intravenous Every 6 hours 06/29/20 1618 06/30/20 0405   06/29/20 1354  vancomycin (VANCOCIN) powder  Status:  Discontinued          As needed 06/29/20 1356 06/29/20 1450   06/29/20 1000  ceFAZolin (ANCEF) IVPB 3g/100 mL premix        3 g 200 mL/hr over 30 Minutes Intravenous On call to O.R. 06/29/20 0626 06/29/20 1215     .  Recent vital signs:  Vitals:   06/30/20 0513 06/30/20 0926  BP: (!) 116/52 119/75  Pulse: 93 100  Resp: 17 17  Temp: 98.4 F (36.9 C)   SpO2: 100% 100%    Recent laboratory studies:  Results for orders placed or performed during the hospital encounter of 06/29/20  Glucose, capillary  Result Value Ref Range   Glucose-Capillary 107  (H) 70 - 99 mg/dL  Glucose, capillary  Result Value Ref Range   Glucose-Capillary 113 (H) 70 - 99 mg/dL  Glucose, capillary  Result Value Ref Range   Glucose-Capillary 94 70 - 99 mg/dL  Pregnancy, urine POC  Result Value Ref Range   Preg Test, Ur NEGATIVE NEGATIVE    Discharge Medications:   Allergies as of 06/30/2020   No Known Allergies      Medication List     TAKE these medications    acetaminophen 500 MG tablet Commonly known as: TYLENOL Take 500 mg by mouth 3 (three) times daily as needed for moderate pain.   aspirin 81 MG chewable tablet Chew 1 tablet (81 mg total) by mouth daily.   buPROPion 150 MG 24 hr tablet Commonly known as: Wellbutrin XL Take 1 tablet (150 mg total) by mouth daily.   docusate sodium 100 MG capsule Commonly known as: COLACE Take 1 capsule (100 mg total) by mouth 2 (two) times daily.   hydrochlorothiazide 25 MG tablet Commonly known as: HYDRODIURIL Take 1 tablet (25 mg total) by mouth daily.   ketorolac 10 MG tablet Commonly known as: TORADOL Take 1 tablet (10 mg total) by mouth every 8 (eight) hours as needed.   methocarbamol 500 MG tablet Commonly known as: ROBAXIN Take 1 tablet (500 mg total) by mouth every 8 (eight) hours as needed  for muscle spasms.   multivitamin with minerals Tabs tablet Take 1 tablet by mouth daily.   oxyCODONE 5 MG immediate release tablet Commonly known as: Oxy IR/ROXICODONE Take 1 tablet (5 mg total) by mouth every 4 (four) hours as needed for moderate pain (pain score 4-6).   sulfamethoxazole-trimethoprim 800-160 MG tablet Commonly known as: BACTRIM DS 1 p.o. twice daily x 5 days        Diagnostic Studies: No results found.  Disposition: Discharge disposition: 01-Home or Self Care       Discharge Instructions     Call MD / Call 911   Complete by: As directed    If you experience chest pain or shortness of breath, CALL 911 and be transported to the hospital emergency room.  If you  develope a fever above 101 F, pus (white drainage) or increased drainage or redness at the wound, or calf pain, call your surgeon's office.   Constipation Prevention   Complete by: As directed    Drink plenty of fluids.  Prune juice may be helpful.  You may use a stool softener, such as Colace (over the counter) 100 mg twice a day.  Use MiraLax (over the counter) for constipation as needed.   Diet - low sodium heart healthy   Complete by: As directed    Discharge instructions   Complete by: As directed    You may shower, dressing is waterproof.  Do not remove the dressing, we will remove it at your first post-op appointment.  Do not take a bath or soak the knee in a tub or pool.  You may weightbear as you can tolerate on the operative leg with a walker.  Continue using the CPM machine 3 times per day for one hour each time, increasing the degrees of range of motion daily.  Use the blue cradle boot under your heel to work on getting your leg straight.  Do NOT put a pillow under your knee.  You will follow-up with Dr. Marlou Sa in the clinic in 2 weeks at your given appointment date.  Call the office at [phone] with any questions or concerns.  INSTRUCTIONS AFTER JOINT REPLACEMENT   Remove items at home which could result in a fall. This includes throw rugs or furniture in walking pathways ICE to the affected joint every three hours while awake for 30 minutes at a time, for at least the first 3-5 days, and then as needed for pain and swelling.  Continue to use ice for pain and swelling. You may notice swelling that will progress down to the foot and ankle.  This is normal after surgery.  Elevate your leg when you are not up walking on it.   Continue to use the breathing machine you got in the hospital (incentive spirometer) which will help keep your temperature down.  It is common for your temperature to cycle up and down following surgery, especially at night when you are not up moving around and  exerting yourself.  The breathing machine keeps your lungs expanded and your temperature down.   DIET:  As you were doing prior to hospitalization, we recommend a well-balanced diet.  DRESSING / WOUND CARE / SHOWERING  Keep the surgical dressing until follow up.  The dressing is water proof, so you can shower without any extra covering.  IF THE DRESSING FALLS OFF or the wound gets wet inside, change the dressing with sterile gauze.  Please use good hand washing techniques before changing the dressing.  Do not use any lotions or creams on the incision until instructed by your surgeon.    ACTIVITY  Increase activity slowly as tolerated, but follow the weight bearing instructions below.   No driving for 6 weeks or until further direction given by your physician.  You cannot drive while taking narcotics.  No lifting or carrying greater than 10 lbs. until further directed by your surgeon. Avoid periods of inactivity such as sitting longer than an hour when not asleep. This helps prevent blood clots.  You may return to work once you are authorized by your doctor.     WEIGHT BEARING   Weight bearing as tolerated with assist device (walker, cane, etc) as directed, use it as long as suggested by your surgeon or therapist, typically at least 4-6 weeks.   EXERCISES  Results after joint replacement surgery are often greatly improved when you follow the exercise, range of motion and muscle strengthening exercises prescribed by your doctor. Safety measures are also important to protect the joint from further injury. Any time any of these exercises cause you to have increased pain or swelling, decrease what you are doing until you are comfortable again and then slowly increase them. If you have problems or questions, call your caregiver or physical therapist for advice.   Rehabilitation is important following a joint replacement. After just a few days of immobilization, the muscles of the leg can become  weakened and shrink (atrophy).  These exercises are designed to build up the tone and strength of the thigh and leg muscles and to improve motion. Often times heat used for twenty to thirty minutes before working out will loosen up your tissues and help with improving the range of motion but do not use heat for the first two weeks following surgery (sometimes heat can increase post-operative swelling).   These exercises can be done on a training (exercise) mat, on the floor, on a table or on a bed. Use whatever works the best and is most comfortable for you.    Use music or television while you are exercising so that the exercises are a pleasant break in your day. This will make your life better with the exercises acting as a break in your routine that you can look forward to.   Perform all exercises about fifteen times, three times per day or as directed.  You should exercise both the operative leg and the other leg as well.  Exercises include:   Quad Sets - Tighten up the muscle on the front of the thigh (Quad) and hold for 5-10 seconds.   Straight Leg Raises - With your knee straight (if you were given a brace, keep it on), lift the leg to 60 degrees, hold for 3 seconds, and slowly lower the leg.  Perform this exercise against resistance later as your leg gets stronger.  Leg Slides: Lying on your back, slowly slide your foot toward your buttocks, bending your knee up off the floor (only go as far as is comfortable). Then slowly slide your foot back down until your leg is flat on the floor again.  Angel Wings: Lying on your back spread your legs to the side as far apart as you can without causing discomfort.  Hamstring Strength:  Lying on your back, push your heel against the floor with your leg straight by tightening up the muscles of your buttocks.  Repeat, but this time bend your knee to a comfortable angle, and push your heel against the  floor.  You may put a pillow under the heel to make it more  comfortable if necessary.   A rehabilitation program following joint replacement surgery can speed recovery and prevent re-injury in the future due to weakened muscles. Contact your doctor or a physical therapist for more information on knee rehabilitation.    CONSTIPATION  Constipation is defined medically as fewer than three stools per week and severe constipation as less than one stool per week.  Even if you have a regular bowel pattern at home, your normal regimen is likely to be disrupted due to multiple reasons following surgery.  Combination of anesthesia, postoperative narcotics, change in appetite and fluid intake all can affect your bowels.   YOU MUST use at least one of the following options; they are listed in order of increasing strength to get the job done.  They are all available over the counter, and you may need to use some, POSSIBLY even all of these options:    Drink plenty of fluids (prune juice may be helpful) and high fiber foods Colace 100 mg by mouth twice a day  Senokot for constipation as directed and as needed Dulcolax (bisacodyl), take with full glass of water  Miralax (polyethylene glycol) once or twice a day as needed.  If you have tried all these things and are unable to have a bowel movement in the first 3-4 days after surgery call either your surgeon or your primary doctor.    If you experience loose stools or diarrhea, hold the medications until you stool forms back up.  If your symptoms do not get better within 1 week or if they get worse, check with your doctor.  If you experience "the worst abdominal pain ever" or develop nausea or vomiting, please contact the office immediately for further recommendations for treatment.   ITCHING:  If you experience itching with your medications, try taking only a single pain pill, or even half a pain pill at a time.  You can also use Benadryl over the counter for itching or also to help with sleep.   TED HOSE STOCKINGS:   Use stockings on both legs until for at least 2 weeks or as directed by physician office. They may be removed at night for sleeping.  MEDICATIONS:  See your medication summary on the "After Visit Summary" that nursing will review with you.  You may have some home medications which will be placed on hold until you complete the course of blood thinner medication.  It is important for you to complete the blood thinner medication as prescribed.  PRECAUTIONS:  If you experience chest pain or shortness of breath - call 911 immediately for transfer to the hospital emergency department.   If you develop a fever greater that 101 F, purulent drainage from wound, increased redness or drainage from wound, foul odor from the wound/dressing, or calf pain - CONTACT YOUR SURGEON.                                                   FOLLOW-UP APPOINTMENTS:  If you do not already have a post-op appointment, please call the office for an appointment to be seen by your surgeon.  Guidelines for how soon to be seen are listed in your "After Visit Summary", but are typically between 1-4 weeks after surgery.  OTHER INSTRUCTIONS:  Knee Replacement:  Do not place pillow under knee, focus on keeping the knee straight while resting. CPM instructions: 0-90 degrees, 2 hours in the morning, 2 hours in the afternoon, and 2 hours in the evening. Place foam block, curve side up under heel at all times except when in CPM or when walking.  DO NOT modify, tear, cut, or change the foam block in any way.  POST-OPERATIVE OPIOID TAPER INSTRUCTIONS: It is important to wean off of your opioid medication as soon as possible. If you do not need pain medication after your surgery it is ok to stop day one. Opioids include: Codeine, Hydrocodone(Norco, Vicodin), Oxycodone(Percocet, oxycontin) and hydromorphone amongst others.  Long term and even short term use of opiods can cause: Increased pain  response Dependence Constipation Depression Respiratory depression And more.  Withdrawal symptoms can include Flu like symptoms Nausea, vomiting And more Techniques to manage these symptoms Hydrate well Eat regular healthy meals Stay active Use relaxation techniques(deep breathing, meditating, yoga) Do Not substitute Alcohol to help with tapering If you have been on opioids for less than two weeks and do not have pain than it is ok to stop all together.  Plan to wean off of opioids This plan should start within one week post op of your joint replacement. Maintain the same interval or time between taking each dose and first decrease the dose.  Cut the total daily intake of opioids by one tablet each day Next start to increase the time between doses. The last dose that should be eliminated is the evening dose.   MAKE SURE YOU:  Understand these instructions.  Get help right away if you are not doing well or get worse.    Thank you for letting us be a part of your medical care team.  It is a privilege we respect greatly.  We hope these instructions will help you stay on track for a fast and full recovery!    Dental Antibiotics:  In most cases prophylactic antibiotics for Dental procdeures after total joint surgery are not necessary.  Exceptions are as follows:  1. History of prior total joint infection  2. Severely immunocompromised (Organ Transplant, cancer chemotherapy, Rheumatoid biologic meds such as [place])  3. Poorly controlled diabetes (A1C &gt; 8.0, blood glucose over 200)  If you have one of these conditions, contact your surgeon for an antibiotic prescription, prior to your dental procedure.   Increase activity slowly as tolerated   Complete by: As directed    Post-operative opioid taper instructions:   Complete by: As directed    POST-OPERATIVE OPIOID TAPER INSTRUCTIONS: It is important to wean off of your opioid medication as soon as possible. If you do  not need pain medication after your surgery it is ok to stop day one. Opioids include: Codeine, Hydrocodone(Norco, Vicodin), Oxycodone(Percocet, oxycontin) and hydromorphone amongst others.  Long term and even short term use of opiods can cause: Increased pain response Dependence Constipation Depression Respiratory depression And more.  Withdrawal symptoms can include Flu like symptoms Nausea, vomiting And more Techniques to manage these symptoms Hydrate well Eat regular healthy meals Stay active Use relaxation techniques(deep breathing, meditating, yoga) Do Not substitute Alcohol to help with tapering If you have been on opioids for less than two weeks and do not have pain than it is ok to stop all together.  Plan to wean off of opioids This plan should start within one week post op of your joint replacement. Maintain the same interval or time  between taking each dose and first decrease the dose.  Cut the total daily intake of opioids by one tablet each day Next start to increase the time between doses. The last dose that should be eliminated is the evening dose.             Signed: Donella Stade 07/05/2020, 9:15 PM

## 2020-07-13 ENCOUNTER — Encounter: Payer: Self-pay | Admitting: Orthopedic Surgery

## 2020-07-13 ENCOUNTER — Ambulatory Visit (INDEPENDENT_AMBULATORY_CARE_PROVIDER_SITE_OTHER): Payer: 59

## 2020-07-13 ENCOUNTER — Ambulatory Visit (INDEPENDENT_AMBULATORY_CARE_PROVIDER_SITE_OTHER): Payer: 59 | Admitting: Orthopedic Surgery

## 2020-07-13 DIAGNOSIS — M1712 Unilateral primary osteoarthritis, left knee: Secondary | ICD-10-CM

## 2020-07-13 NOTE — Progress Notes (Signed)
Post-Op Visit Note   Patient: Nicole Deleon           Date of Birth: 14-Mar-1980           MRN: [id number] Visit Date: 07/13/2020 PCP: Carlena Hurl, PA-C   Assessment & Plan:  Chief Complaint:  Chief Complaint  Patient presents with   Left Knee - Routine Post Op   Visit Diagnoses:  1. Patellofemoral arthritis of left knee     Plan: Patient is a 40 year old female who presents s/p left knee patellofemoral arthroplasty on 06/29/2020.  She reports that she is doing well and pain is controlled.  She is only taking oxycodone 3-4 times in the last week.  She is compliant with taking aspirin for DVT prophylaxis.  Denies any chest pain, shortness of breath, calf pain.  She did have 1 episode of a "twinge" near her kneecap while she was walking 1 day last weekend but this has not resulted in any patellar dislocation or subluxation episodes.  She is on CPM machine up to 75 degrees.  She is ambulating with a walker with little pain.  Radiographs taken today show patellofemoral prosthesis in excellent position with no evidence of dislocation or subluxation.  On exam she has 3 degrees of left knee extension with 85 degrees of knee flexion.  She has no calf tenderness.  Negative Homans' sign.  Able to perform straight leg raise with no extensor lag but only able to perform 1-2 straight leg raises.  She is able to fire her quad.  Incision is healing well with no evidence of infection or dehiscence.  Plan to start outpatient physical therapy for her left knee with focus on passive and active range of motion as well as quadricep strengthening.  She will wean off of the walker as her quadricep strength improves.  Follow-up in 4 weeks for clinical recheck with Dr. Marlou Sa.  Follow-Up Instructions: No follow-ups on file.   Orders:  Orders Placed This Encounter  Procedures   XR Knee 1-2 Views Left   Ambulatory referral to Physical Therapy   No orders of the defined types were placed in this  encounter.   Imaging: No results found.  PMFS History: Patient Active Problem List   Diagnosis Date Noted   Status post left partial knee replacement 06/29/2020   Prediabetes 06/23/2020   Patellofemoral arthritis of left knee 04/04/2020   Chest pain 04/04/2020   Depressed mood 04/04/2020   Essential hypertension, benign 04/04/2020   Lipoma of lower extremity 04/04/2020   Tobacco use 04/04/2020   Past Medical History:  Diagnosis Date   Allergy    Anxiety    Arthritis    left knee   Chronic back pain    Depression    Headache    Hypertension    Pre-diabetes    diet controlled   SVD (spontaneous vaginal delivery)    x 3    Family History  Problem Relation Age of Onset   Hypertension Mother    Hypertension Father    Hyperlipidemia Father     Past Surgical History:  Procedure Laterality Date   no previous surgeries     PATELLA-FEMORAL ARTHROPLASTY Left 06/29/2020   Procedure: LEFT PATELLA-FEMORAL ARTHROPLASTY;  Surgeon: Meredith Pel, MD;  Location: [facility];  Service: Orthopedics;  Laterality: Left;   TUBAL LIGATION     Social History   Occupational History   Not on file  Tobacco Use   Smoking status: Former  Packs/day: 0.50    Years: 12.00    Pack years: 6.00    Types: Cigarettes    Quit date: 02/08/2020    Years since quitting: 0.4   Smokeless tobacco: Never   Tobacco comments:    Pt states had last cigarette yesterday 06/27/20.  Vaping Use   Vaping Use: Former  Substance and Sexual Activity   Alcohol use: Yes    Alcohol/week: 3.0 standard drinks    Types: 3 Standard drinks or equivalent per week    Comment: occassional liquor   Drug use: Not Currently    Types: Marijuana    Comment: Last use was on 06/23/20   Sexual activity: Not Currently    Birth control/protection: None    Comment: Tubal Ligation

## 2020-07-17 ENCOUNTER — Encounter: Payer: Self-pay | Admitting: Physical Therapy

## 2020-07-17 ENCOUNTER — Ambulatory Visit (INDEPENDENT_AMBULATORY_CARE_PROVIDER_SITE_OTHER): Payer: 59 | Admitting: Physical Therapy

## 2020-07-17 ENCOUNTER — Other Ambulatory Visit: Payer: Self-pay

## 2020-07-17 DIAGNOSIS — M25562 Pain in left knee: Secondary | ICD-10-CM | POA: Diagnosis not present

## 2020-07-17 DIAGNOSIS — R600 Localized edema: Secondary | ICD-10-CM

## 2020-07-17 DIAGNOSIS — R262 Difficulty in walking, not elsewhere classified: Secondary | ICD-10-CM

## 2020-07-17 DIAGNOSIS — M25662 Stiffness of left knee, not elsewhere classified: Secondary | ICD-10-CM

## 2020-07-17 NOTE — Therapy (Signed)
[facility] [address] [place], [place], [address] Phone: [phone]   Fax:  [phone]  Physical Therapy Evaluation  Patient Details  Name: Nicole Deleon MRN: [id number] Date of Birth: 1980-12-25 Referring Provider (PT): Marcene Duos MD   Encounter Date: 07/17/2020   PT End of Session - 07/17/20 1308     Visit Number 1    Number of Visits 16    Date for PT Re-Evaluation 09/15/20    Progress Note Due on Visit 10    PT Start Time 1300    PT Stop Time 1342    PT Time Calculation (min) 42 min    Equipment Utilized During Treatment Gait belt    Activity Tolerance Patient limited by fatigue;Patient limited by pain    Behavior During Therapy WFL for tasks assessed/performed             Past Medical History:  Diagnosis Date   Allergy    Anxiety    Arthritis    left knee   Chronic back pain    Depression    Headache    Hypertension    Pre-diabetes    diet controlled   SVD (spontaneous vaginal delivery)    x 3    Past Surgical History:  Procedure Laterality Date   no previous surgeries     PATELLA-FEMORAL ARTHROPLASTY Left 06/29/2020   Procedure: LEFT PATELLA-FEMORAL ARTHROPLASTY;  Surgeon: Meredith Pel, MD;  Location: [place];  Service: Orthopedics;  Laterality: Left;   TUBAL LIGATION      There were no vitals filed for this visit.    Subjective Assessment - 07/17/20 1303     Subjective Pt arriving to [facility] s/p left patellafemoral arthroplasty on 06/29/2020. Pt arriving reporting 7/10 pain in left anterior knee. Pt amb today with rolling walker.    Pertinent History HTN, arthritis, back pain, depression    Limitations Walking    Diagnostic tests MRI: patellofemoral arthritis left knee    Patient Stated Goals Walk without pain, use my leg normally again    Currently in Pain? Yes    Pain Score 7     Pain Location Knee    Pain Orientation Left    Pain Descriptors / Indicators Aching;Sore;Tightness    Pain  Type Surgical pain    Pain Onset 1 to 4 weeks ago    Pain Frequency Constant    Aggravating Factors  bending, walking    Pain Relieving Factors ice, over the counter pain meds    Effect of Pain on Daily Activities sleeping, difficulty with ADL's                [facility] PT Assessment - 07/17/20 0001       Assessment   Medical Diagnosis M17.12 left patellofemoral arthritis    Referring Provider (PT) Marcene Duos MD    Onset Date/Surgical Date 06/29/20    Hand Dominance Right    Prior Therapy yes, years ago for left knee      Precautions   Precautions None      Restrictions   Weight Bearing Restrictions No      Balance Screen   Has the patient fallen in the past 6 months No    Is the patient reluctant to leave their home because of a fear of falling?  No      Home Ecologist residence      Prior Function   Level of Independence Independent  Vocation Full time employment    Community education officer at [employer]   Overall Cognitive Status Within Functional Limits for tasks assessed      Observation/Other Assessments   Focus on Therapeutic Outcomes (FOTO)  43(predicted 61)      Observation/Other Assessments-Edema    Edema Circumferential      Circumferential Edema   Circumferential - Right 49 centimeters    Circumferential - Left  53 centimeters      Posture/Postural Control   Posture/Postural Control Postural limitations    Postural Limitations Rounded Shoulders;Forward head      ROM / Strength   AROM / PROM / Strength AROM;PROM;Strength      AROM   Overall AROM  Deficits    AROM Assessment Site Knee    Right/Left Knee Right;Left    Right Knee Extension 0    Right Knee Flexion 128    Left Knee Extension 0    Left Knee Flexion 90      PROM   PROM Assessment Site Knee    Right/Left Knee Left    Left Knee Extension 0    Left Knee Flexion 96      Strength   Strength Assessment  Site Knee    Right/Left Knee Right;Left    Right Knee Flexion 5/5    Right Knee Extension 5/5    Left Knee Flexion 3/5    Left Knee Extension 2/5      Palpation   Palpation comment TTP: around incision site, anterior inferior knee numbness noted      Transfers   Five time sit to stand comments  27 seconds with UE support      Ambulation/Gait   Assistive device Rolling walker    Gait Pattern Step-through pattern;Decreased step length - left;Decreased stance time - left;Decreased stride length;Decreased hip/knee flexion - left;Antalgic    Ambulation Surface Level                        Objective measurements completed on examination: See above findings.       [facility] Adult PT Treatment/Exercise - 07/17/20 0001       Exercises   Exercises Knee/Hip      Knee/Hip Exercises: Stretches   Passive Hamstring Stretch 2 reps;20 seconds      Knee/Hip Exercises: Seated   Long Arc Quad Strengthening;Left;10 reps    Marching Strengthening;Both;10 reps    Sit to [employer] 5 reps;with UE support      Knee/Hip Exercises: Supine   Short Arc [employer] Strengthening;Left;10 reps    Short Arc Quad Sets Limitations holding 5 seconds    Straight Leg Raises Limitations unable to perform due to weakness noted in hip flexor and quads                    PT Education - 07/17/20 1307     Education Details PT  POC, HEP    Person(s) Educated Patient    Methods Explanation;Demonstration;Handout    Comprehension Verbalized understanding;Returned demonstration              PT Short Term Goals - 07/17/20 1309       PT SHORT TERM GOAL #1   Title Pt will be independent in her initial HEP.    Time 4    Period Weeks    Status New    Target Date 08/18/20  PT SHORT TERM GOAL #2   Title Pt will improve her 5 time sit to stand to </= 14 seconds.    Baseline 27 seconds with UE support    Time 4    Period Weeks    Status New    Target Date 08/18/20                PT Long Term Goals - 07/17/20 1530       PT LONG TERM GOAL #1   Title Pt will be independent in her advanced HEP.    Time 8    Period Weeks    Status New    Target Date 09/15/20      PT LONG TERM GOAL #2   Title Pt will be able to amb without assistive device for community distances and surfaces with pain </= 2/10 in her left knee.    Baseline amb with rolling walker with 5-7/10 pain    Time 8    Period Weeks    Status New    Target Date 09/15/20      PT LONG TERM GOAL #3   Title Pt will improve her left knee active ROM to >/= 0-120 degrees.    Time 8    Period Weeks    Status New    Target Date 09/15/20      PT LONG TERM GOAL #4   Title Pt will be able to increase her left knee strength to >/= 4+/5 in order to improve gait and functional mobility.    Time 8    Period Weeks    Status New    Target Date 09/15/20      PT LONG TERM GOAL #5   Title Pt will improve her FOTO from 43% to >/= 61%.    Time 8    Period Weeks    Status New    Target Date 09/15/20                    Plan - 07/17/20 1701     Clinical Impression Statement Pt is a 40 year old female presenting today for PT evaluation with dx of patellofemoral arthritis in her left knee. Pt is s/p left patellofemoral arthroplasty on 06/29/2020. Pt currently amb with a rolling walker. Pt has a CPM machine at home which she states is set to 90 degrees. Pt unable to perform a SLR on the left due to weakness in hip and quad strength. Pt with limitations noted with sit to stand. Pt also presneting with active ROM in left knee 0-90 degrees and passive ROM 0-96 degrees. Pt was instructed to increase her CPM setting for increased ROM and edu in a HEP. Pt also educated in ice and elevation for increased edema. Skilled PT needed to address pt's impairments with the below interventions.    Personal Factors and Comorbidities Comorbidity 3+    Comorbidities anxiety, arthritis, headaches, allergies, pre-diabetic     Examination-Activity Limitations Lift;Squat;Stairs;Stand;Transfers;Other    Examination-Participation Restrictions Other;Occupation;Community Activity    Stability/Clinical Decision Making Stable/Uncomplicated    Clinical Decision Making Low    Rehab Potential Good    PT Frequency 2x / week    PT Duration 8 weeks    PT Treatment/Interventions Electrical Stimulation;ADLs/Self Care Home Management;Gait training;Stair training;Functional mobility training;Therapeutic activities;Therapeutic exercise;Balance training;Neuromuscular re-education;Patient/family education;Passive range of motion;Manual techniques;Taping;Vasopneumatic Device    PT Next Visit Plan Nustep vs bike, LE strengthening, left knee ROM, sit to stand    PT  Home Exercise Plan Access Code: [email or web address]  URL: [email or web address]  Date: 07/17/2020  Prepared by: Kearney Hard    Exercises  Seated Heel Slide - 3-4 x daily - 7 x weekly - 2 sets - 10 reps  Supine Short Arc Quad - 3-4 x daily - 7 x weekly - 2 sets - 10 reps  Seated Knee Extension AROM - 3-4 x daily - 7 x weekly - 2 sets - 10 reps - 3-5 seconds hold  Sit to Stand with Counter Support - 3-4 x daily - 7 x weekly - 3 sets - 10 reps  Seated March - 3-4 x daily - 7 x weekly - 2 sets - 10 reps  Hooklying Hamstring Stretch with Strap - 3-4 x daily - 7 x weekly - 3 reps - 30 seconds hold    Consulted and Agree with Plan of Care Patient             Patient will benefit from skilled therapeutic intervention in order to improve the following deficits and impairments:  Pain, Decreased strength, Decreased mobility, Increased edema, Decreased balance, Impaired flexibility, Decreased activity tolerance, Difficulty walking, Decreased range of motion  Visit Diagnosis: Acute pain of left knee - Plan: PT plan of care cert/re-cert  Stiffness of left knee, not elsewhere classified - Plan: PT plan of care cert/re-cert  Localized edema - Plan: PT plan of care  cert/re-cert  Difficulty in walking, not elsewhere classified - Plan: PT plan of care cert/re-cert     Problem List Patient Active Problem List   Diagnosis Date Noted   Status post left partial knee replacement 06/29/2020   Prediabetes 06/23/2020   Patellofemoral arthritis of left knee 04/04/2020   Chest pain 04/04/2020   Depressed mood 04/04/2020   Essential hypertension, benign 04/04/2020   Lipoma of lower extremity 04/04/2020   Tobacco use 04/04/2020    Oretha Caprice, PT, MPT 07/17/2020, 5:14 PM  [facility] [address] [place], [place], [address] Phone: [phone]   Fax:  [phone]  Name: SUSANE BEY MRN: [id number] Date of Birth: [birth year]

## 2020-07-17 NOTE — Patient Instructions (Signed)
Access Code: [email or web address] URL: [email or web address] Date: 07/17/2020 Prepared by: Kearney Hard  Exercises Seated Heel Slide - 3-4 x daily - 7 x weekly - 2 sets - 10 reps Supine Short Arc Quad - 3-4 x daily - 7 x weekly - 2 sets - 10 reps Seated Knee Extension AROM - 3-4 x daily - 7 x weekly - 2 sets - 10 reps - 3-5 seconds hold Sit to Stand with Counter Support - 3-4 x daily - 7 x weekly - 3 sets - 10 reps Seated March - 3-4 x daily - 7 x weekly - 2 sets - 10 reps Hooklying Hamstring Stretch with Strap - 3-4 x daily - 7 x weekly - 3 reps - 30 seconds hold

## 2020-07-18 ENCOUNTER — Ambulatory Visit (INDEPENDENT_AMBULATORY_CARE_PROVIDER_SITE_OTHER): Payer: 59 | Admitting: Physical Therapy

## 2020-07-18 DIAGNOSIS — M25662 Stiffness of left knee, not elsewhere classified: Secondary | ICD-10-CM

## 2020-07-18 DIAGNOSIS — R262 Difficulty in walking, not elsewhere classified: Secondary | ICD-10-CM

## 2020-07-18 DIAGNOSIS — R600 Localized edema: Secondary | ICD-10-CM

## 2020-07-18 DIAGNOSIS — M25562 Pain in left knee: Secondary | ICD-10-CM

## 2020-07-18 NOTE — Therapy (Signed)
[facility] [address] [place], [place], [address] Phone: [phone]   Fax:  [phone]  Physical Therapy Treatment  Patient Details  Name: Nicole Deleon MRN: [id number] Date of Birth: 1980-07-31 Referring Provider (PT): Marcene Duos MD   Encounter Date: 07/18/2020   PT End of Session - 07/18/20 1353     Visit Number 2    Number of Visits 16    Date for PT Re-Evaluation 09/15/20    Progress Note Due on Visit 10    PT Start Time 1345    PT Stop Time 1430    PT Time Calculation (min) 45 min    Equipment Utilized During Treatment Gait belt    Activity Tolerance Patient limited by fatigue;Patient limited by pain    Behavior During Therapy WFL for tasks assessed/performed             Past Medical History:  Diagnosis Date   Allergy    Anxiety    Arthritis    left knee   Chronic back pain    Depression    Headache    Hypertension    Pre-diabetes    diet controlled   SVD (spontaneous vaginal delivery)    x 3    Past Surgical History:  Procedure Laterality Date   no previous surgeries     PATELLA-FEMORAL ARTHROPLASTY Left 06/29/2020   Procedure: LEFT PATELLA-FEMORAL ARTHROPLASTY;  Surgeon: Meredith Pel, MD;  Location: [place];  Service: Orthopedics;  Laterality: Left;   TUBAL LIGATION      There were no vitals filed for this visit.   Subjective Assessment - 07/18/20 1351     Subjective Pt arriving today reporting 4-5/10 in left knee. Pt reporting stiffness.    Pertinent History HTN, arthritis, back pain, depression    Limitations Walking    Diagnostic tests MRI: patellofemoral arthritis left knee    Patient Stated Goals Walk without pain, use my leg normally again    Currently in Pain? Yes    Pain Score 5     Pain Location Knee    Pain Orientation Left    Pain Descriptors / Indicators Aching;Tightness    Pain Type Surgical pain    Pain Onset 1 to 4 weeks ago                [facility] PT Assessment -  07/18/20 0001       Assessment   Medical Diagnosis M17.12 left patellofemoral arthritis    Referring Provider (PT) Marcene Duos MD    Onset Date/Surgical Date 06/29/20    Hand Dominance Right      AROM   Overall AROM Comments measured in supine    AROM Assessment Site Knee    Right/Left Knee Left    Left Knee Extension 0    Left Knee Flexion 100      PROM   Overall PROM Comments measured in supine    PROM Assessment Site Knee    Right/Left Knee Left    Left Knee Extension 0    Left Knee Flexion 105                           [facility] Adult PT Treatment/Exercise - 07/18/20 0001       Ambulation/Gait   Assistive device Rolling walker    Gait Pattern Step-through pattern;Decreased step length - left;Decreased stance time - left;Decreased stride length;Decreased hip/knee flexion - left;Antalgic    Ambulation Surface  Level      Exercises   Exercises Knee/Hip      Knee/Hip Exercises: Stretches   Gastroc Stretch 30 seconds;2 reps    Gastroc Stretch Limitations slant board      Knee/Hip Exercises: Aerobic   Recumbent Bike L1 rocking back and forth x 8 minutes      Knee/Hip Exercises: Machines for Strengthening   Cybex Leg Press double leg press: 100# 2x15, Left LE only: 50# 2x10      Knee/Hip Exercises: Standing   Heel Raises 15 reps;2 seconds;Limitations    Heel Raises Limitations UE support      Knee/Hip Exercises: Seated   Sit to Sand with UE support;15 reps      Knee/Hip Exercises: Supine   Short Arc Quad Sets Strengthening;Left;10 reps    Short Arc Quad Sets Limitations 5 second hold    Bridges Strengthening;Both;2 sets;10 reps    Bridges Limitations ball squeezes      Modalities   Modalities Vasopneumatic      Vasopneumatic   Number Minutes Vasopneumatic  10 minutes    Vasopnuematic Location  Knee    Vasopneumatic Pressure Low    Vasopneumatic Temperature  34      Manual Therapy   Manual Therapy Passive ROM    Passive ROM knee flexion                       PT Short Term Goals - 07/18/20 1357       PT SHORT TERM GOAL #1   Title Pt will be independent in her initial HEP.    Status On-going      PT SHORT TERM GOAL #2   Title Pt will improve her 5 time sit to stand to </= 14 seconds.    Status On-going               PT Long Term Goals - 07/17/20 1530       PT LONG TERM GOAL #1   Title Pt will be independent in her advanced HEP.    Time 8    Period Weeks    Status New    Target Date 09/15/20      PT LONG TERM GOAL #2   Title Pt will be able to amb without assistive device for community distances and surfaces with pain </= 2/10 in her left knee.    Baseline amb with rolling walker with 5-7/10 pain    Time 8    Period Weeks    Status New    Target Date 09/15/20      PT LONG TERM GOAL #3   Title Pt will improve her left knee active ROM to >/= 0-120 degrees.    Time 8    Period Weeks    Status New    Target Date 09/15/20      PT LONG TERM GOAL #4   Title Pt will be able to increase her left knee strength to >/= 4+/5 in order to improve gait and functional mobility.    Time 8    Period Weeks    Status New    Target Date 09/15/20      PT LONG TERM GOAL #5   Title Pt will improve her FOTO from 43% to >/= 61%.    Time 8    Period Weeks    Status New    Target Date 09/15/20  Plan - 07/18/20 1353     Clinical Impression Statement Pt tolerating exercises well today progresing with ROM and strengthening left quad. AROM measured 0-100 degrees. PROM measured to 0-105 degrees. Pt reporrting upping her CPM to 102 at home. Pt instructed to increase her CPM up to 105-110.  Continue skilled PT to maximize function.    Personal Factors and Comorbidities Comorbidity 3+    Comorbidities anxiety, arthritis, headaches, allergies, pre-diabetic    Examination-Activity Limitations Lift;Squat;Stairs;Stand;Transfers;Other    Examination-Participation Restrictions  Other;Occupation;Community Activity    Stability/Clinical Decision Making Stable/Uncomplicated    Rehab Potential Good    PT Frequency 2x / week    PT Duration 8 weeks    PT Treatment/Interventions Electrical Stimulation;ADLs/Self Care Home Management;Gait training;Stair training;Functional mobility training;Therapeutic activities;Therapeutic exercise;Balance training;Neuromuscular re-education;Patient/family education;Passive range of motion;Manual techniques;Taping;Vasopneumatic Device    PT Next Visit Plan Nustep vs bike, LE strengthening, left knee ROM, sit to stand    PT Home Exercise Plan Access Code: [email or web address]  URL: [email or web address]  Date: 07/17/2020  Prepared by: Kearney Hard    Exercises  Seated Heel Slide - 3-4 x daily - 7 x weekly - 2 sets - 10 reps  Supine Short Arc Quad - 3-4 x daily - 7 x weekly - 2 sets - 10 reps  Seated Knee Extension AROM - 3-4 x daily - 7 x weekly - 2 sets - 10 reps - 3-5 seconds hold  Sit to Stand with Counter Support - 3-4 x daily - 7 x weekly - 3 sets - 10 reps  Seated March - 3-4 x daily - 7 x weekly - 2 sets - 10 reps  Hooklying Hamstring Stretch with Strap - 3-4 x daily - 7 x weekly - 3 reps - 30 seconds hold    Consulted and Agree with Plan of Care Patient             Patient will benefit from skilled therapeutic intervention in order to improve the following deficits and impairments:  Pain, Decreased strength, Decreased mobility, Increased edema, Decreased balance, Impaired flexibility, Decreased activity tolerance, Difficulty walking, Decreased range of motion  Visit Diagnosis: Acute pain of left knee  Stiffness of left knee, not elsewhere classified  Localized edema  Difficulty in walking, not elsewhere classified     Problem List Patient Active Problem List   Diagnosis Date Noted   Status post left partial knee replacement 06/29/2020   Prediabetes 06/23/2020   Patellofemoral arthritis of left knee 04/04/2020    Chest pain 04/04/2020   Depressed mood 04/04/2020   Essential hypertension, benign 04/04/2020   Lipoma of lower extremity 04/04/2020   Tobacco use 04/04/2020    Oretha Caprice, PT, MPT 07/18/2020, 2:24 PM  [facility] [address] [place], [place], [address] Phone: [phone]   Fax:  [phone]  Name: NABILA ALBARRACIN MRN: [id number] Date of Birth: September 05, 1980

## 2020-07-22 ENCOUNTER — Other Ambulatory Visit: Payer: Self-pay | Admitting: Surgical

## 2020-07-24 NOTE — Telephone Encounter (Signed)
No need to refill thx

## 2020-07-25 ENCOUNTER — Encounter: Payer: 59 | Admitting: Physical Therapy

## 2020-07-26 ENCOUNTER — Other Ambulatory Visit: Payer: Self-pay

## 2020-07-26 ENCOUNTER — Encounter: Payer: Self-pay | Admitting: Physical Therapy

## 2020-07-26 ENCOUNTER — Ambulatory Visit (INDEPENDENT_AMBULATORY_CARE_PROVIDER_SITE_OTHER): Payer: 59 | Admitting: Physical Therapy

## 2020-07-26 DIAGNOSIS — R262 Difficulty in walking, not elsewhere classified: Secondary | ICD-10-CM

## 2020-07-26 DIAGNOSIS — R600 Localized edema: Secondary | ICD-10-CM

## 2020-07-26 DIAGNOSIS — M25662 Stiffness of left knee, not elsewhere classified: Secondary | ICD-10-CM

## 2020-07-26 DIAGNOSIS — M25562 Pain in left knee: Secondary | ICD-10-CM

## 2020-07-26 NOTE — Patient Instructions (Signed)
Access Code: [email or web address] URL: [email or web address] Date: 07/26/2020 Prepared by: Jamey Reas  Exercises  Supine Quadriceps Stretch with Strap on Table - 1-3 x daily - 7 x weekly - 1 sets - 2-3 reps - 20-30 seconds hold Supine Dynamic Modified Karl Pock and Hip Flexor Dynamic Stretch - 1-3 x daily - 7 x weekly - 1 sets - 2-3 reps - 20-30 seconds hold Small Range Straight Leg Raise - 1-3 x daily - 7 x weekly - 2 sets - 10 reps - 5 seconds hold  Patient Education Ice Massage Scar Massage

## 2020-07-26 NOTE — Therapy (Addendum)
[facility] Physical Therapy [address] [place], [place], [address] Phone: [phone]   Fax:  [phone]  Physical Therapy Treatment  Patient Details  Name: Nicole Deleon MRN: [id number] Date of Birth: [birth year] Referring Provider (PT): Marcene Duos MD   Encounter Date: 07/26/2020   PT End of Session - 07/26/20 1114     Visit Number 3    Number of Visits 16    Date for PT Re-Evaluation 09/15/20    Progress Note Due on Visit 10    PT Start Time 1021    PT Stop Time 1120    PT Time Calculation (min) 59 min    Equipment Utilized During Treatment Gait belt    Activity Tolerance Patient limited by fatigue;Patient limited by pain    Behavior During Therapy WFL for tasks assessed/performed             Past Medical History:  Diagnosis Date   Allergy    Anxiety    Arthritis    left knee   Chronic back pain    Depression    Headache    Hypertension    Pre-diabetes    diet controlled   SVD (spontaneous vaginal delivery)    x 3    Past Surgical History:  Procedure Laterality Date   no previous surgeries     PATELLA-FEMORAL ARTHROPLASTY Left 06/29/2020   Procedure: LEFT PATELLA-FEMORAL ARTHROPLASTY;  Surgeon: Meredith Pel, MD;  Location: [place];  Service: Orthopedics;  Laterality: Left;   TUBAL LIGATION      There were no vitals filed for this visit.   Subjective Assessment - 07/26/20 1021     Subjective She was able to drive to store but left knee stiffened up.    Pertinent History HTN, arthritis, back pain, depression    Limitations Walking    Diagnostic tests MRI: patellofemoral arthritis left knee    Patient Stated Goals Walk without pain, use my leg normally again    Currently in Pain? No/denies    Pain Onset 1 to 4 weeks ago                               [facility] Adult PT Treatment/Exercise - 07/26/20 1021       Ambulation/Gait   Ambulation/Gait Yes    Ambulation/Gait Assistance 5: Supervision     Ambulation Distance (Feet) 20 Feet   20' inside //bars without UE support, 15' outside bars.   Assistive device Rolling walker;None    Gait Pattern Step-through pattern;Decreased step length - left;Decreased stance time - left;Decreased stride length;Decreased hip/knee flexion - left;Antalgic    Ambulation Surface Level;Indoor      Self-Care   Self-Care Scar Mobilizations;Heat/Ice Application    Scar Mobilizations PT performed scar mobilizations counter motion 4 directions. Pt verbalized understanding for home.    Heat/Ice Application PT performed 3 min ice massage with instruction to pt in rationale & technique.      Exercises   Exercises Knee/Hip      Knee/Hip Exercises: Stretches   Quad Stretch Left;3 reps;20 seconds    Quad Stretch Limitations supine with strap knee flexion.  1st rep RLE flat, 2nd rep RLE hooklying,  3rd rep RLE knee to chest    Gastroc Stretch --    Gastroc Stretch Limitations --      Knee/Hip Exercises: Aerobic   Nustep seat 13 level 5 with BUEs & BLEs for 8 min with focus  on full range.      Knee/Hip Exercises: Machines for Strengthening   Cybex Leg Press double leg press: 100# 2x15, Left LE only: 50# 2x15    Other Machine shuttle leg press heel raises BLEs 100# 15 reps 2 sets      Knee/Hip Exercises: Standing   Heel Raises --    Heel Raises Limitations --    Knee Flexion AROM;Left;1 set;10 reps    Knee Flexion Limitations stepping over 2" block 8" long for facilitating swing phase clearance.    Terminal Knee Extension Strengthening;Left;4 sets;15 reps;Theraband    Theraband Level (Terminal Knee Extension) Level 3 (Green)    Terminal Knee Extension Limitations 1st set bilateral stance,  2nd set terminal stance, 3rd set initial contact,  4th set hold knee ext while tapping RLE on cone      Knee/Hip Exercises: Seated   Sit to Sand --      Knee/Hip Exercises: Supine   Short Arc Quad Sets --    Constance Haw --    Constance Haw Limitations --    Straight Leg Raises  Strengthening;Left;1 set;10 reps      Modalities   Modalities Vasopneumatic      Vasopneumatic   Number Minutes Vasopneumatic  10 minutes    Vasopnuematic Location  Knee    Vasopneumatic Pressure Medium    Vasopneumatic Temperature  34      Manual Therapy   Manual Therapy Passive ROM    Passive ROM knee flexion                    PT Education - 07/26/20 1128     Education Details Access Code: [email or web address]  updated with quad stretch & SLR, scar mob & ice massage    Person(s) Educated Patient    Methods Explanation;Demonstration;Tactile cues;Verbal cues;Handout    Comprehension Verbalized understanding;Returned demonstration;Verbal cues required;Tactile cues required              PT Short Term Goals - 07/18/20 1357       PT SHORT TERM GOAL #1   Title Pt will be independent in her initial HEP.    Status On-going      PT SHORT TERM GOAL #2   Title Pt will improve her 5 time sit to stand to </= 14 seconds.    Status On-going               PT Long Term Goals - 07/17/20 1530       PT LONG TERM GOAL #1   Title Pt will be independent in her advanced HEP.    Time 8    Period Weeks    Status New    Target Date 09/15/20      PT LONG TERM GOAL #2   Title Pt will be able to amb without assistive device for community distances and surfaces with pain </= 2/10 in her left knee.    Baseline amb with rolling walker with 5-7/10 pain    Time 8    Period Weeks    Status New    Target Date 09/15/20      PT LONG TERM GOAL #3   Title Pt will improve her left knee active ROM to >/= 0-120 degrees.    Time 8    Period Weeks    Status New    Target Date 09/15/20      PT LONG TERM GOAL #4   Title Pt will be able to increase her left knee  strength to >/= 4+/5 in order to improve gait and functional mobility.    Time 8    Period Weeks    Status New    Target Date 09/15/20      PT LONG TERM GOAL #5   Title Pt will improve her FOTO from 43% to >/= 61%.    Time 8     Period Weeks    Status New    Target Date 09/15/20                   Plan - 07/26/20 1115     Clinical Impression Statement PT instructed in scar mobilization & ice massage which she appears to understand.  Pt had more fluent Lt knee motion with Nustep.  PTadded standing knee ext to program which she tolerated well.  She reports able to move LLE now without assisting with hands.    Personal Factors and Comorbidities Comorbidity 3+    Comorbidities anxiety, arthritis, headaches, allergies, pre-diabetic    Examination-Activity Limitations Lift;Squat;Stairs;Stand;Transfers;Other    Examination-Participation Restrictions Other;Occupation;Community Activity    Stability/Clinical Decision Making Stable/Uncomplicated    Rehab Potential Good    PT Frequency 2x / week    PT Duration 8 weeks    PT Treatment/Interventions Electrical Stimulation;ADLs/Self Care Home Management;Gait training;Stair training;Functional mobility training;Therapeutic activities;Therapeutic exercise;Balance training;Neuromuscular re-education;Patient/family education;Passive range of motion;Manual techniques;Taping;Vasopneumatic Device    PT Next Visit Plan Nustep, LE strengthening including standing, left knee ROM, sit to stand    PT Home Exercise Plan Access Code: [email or web address]    Consulted and Agree with Plan of Care Patient             Patient will benefit from skilled therapeutic intervention in order to improve the following deficits and impairments:  Pain, Decreased strength, Decreased mobility, Increased edema, Decreased balance, Impaired flexibility, Decreased activity tolerance, Difficulty walking, Decreased range of motion  Visit Diagnosis: Acute pain of left knee  Stiffness of left knee, not elsewhere classified  Localized edema  Difficulty in walking, not elsewhere classified     Problem List Patient Active Problem List   Diagnosis Date Noted   Status post left partial knee replacement  06/29/2020   Prediabetes 06/23/2020   Patellofemoral arthritis of left knee 04/04/2020   Chest pain 04/04/2020   Depressed mood 04/04/2020   Essential hypertension, benign 04/04/2020   Lipoma of lower extremity 04/04/2020   Tobacco use 04/04/2020    Jamey Reas, PT, DPT 07/26/2020, 11:29 AM  [facility] Physical Therapy [address] [place], [place], [address] Phone: [phone]   Fax:  [phone]  Name: Nicole Deleon MRN: [id number] Date of Birth: 29-Dec-1980

## 2020-07-28 ENCOUNTER — Telehealth: Payer: Self-pay | Admitting: Rehabilitative and Restorative Service Providers"

## 2020-07-28 ENCOUNTER — Encounter: Payer: 59 | Admitting: Rehabilitative and Restorative Service Providers"

## 2020-07-28 NOTE — Telephone Encounter (Signed)
Called to check on Nicole Deleon due to her no show today.  Spoke with her.  She did not realize she had an appointment.  Offered her my 1 PM opening today which she declined so I reminded her of her 1 PM appointment on Monday with Morene Antu PT, DPT.

## 2020-07-31 ENCOUNTER — Other Ambulatory Visit: Payer: Self-pay

## 2020-07-31 ENCOUNTER — Encounter: Payer: Self-pay | Admitting: Physical Therapy

## 2020-07-31 ENCOUNTER — Ambulatory Visit (INDEPENDENT_AMBULATORY_CARE_PROVIDER_SITE_OTHER): Payer: 59 | Admitting: Physical Therapy

## 2020-07-31 DIAGNOSIS — R600 Localized edema: Secondary | ICD-10-CM

## 2020-07-31 DIAGNOSIS — M25562 Pain in left knee: Secondary | ICD-10-CM

## 2020-07-31 DIAGNOSIS — R262 Difficulty in walking, not elsewhere classified: Secondary | ICD-10-CM

## 2020-07-31 DIAGNOSIS — M25662 Stiffness of left knee, not elsewhere classified: Secondary | ICD-10-CM | POA: Diagnosis not present

## 2020-07-31 NOTE — Therapy (Signed)
[facility] Physical Therapy [address] [place], [place], [address] Phone: [phone]   Fax:  [phone]  Physical Therapy Treatment  Patient Details  Name: Nicole Deleon MRN: [id number] Date of Birth: [birth year] Referring Provider (PT): Marcene Duos MD   Encounter Date: 07/31/2020   PT End of Session - 07/31/20 1315     Visit Number 4    Number of Visits 16    Date for PT Re-Evaluation 09/15/20    Progress Note Due on Visit 10    PT Start Time 1300    PT Stop Time 1348    PT Time Calculation (min) 48 min    Activity Tolerance Patient limited by fatigue;Patient limited by pain    Behavior During Therapy [facility] for tasks assessed/performed             Past Medical History:  Diagnosis Date   Allergy    Anxiety    Arthritis    left knee   Chronic back pain    Depression    Headache    Hypertension    Pre-diabetes    diet controlled   SVD (spontaneous vaginal delivery)    x 3    Past Surgical History:  Procedure Laterality Date   no previous surgeries     PATELLA-FEMORAL ARTHROPLASTY Left 06/29/2020   Procedure: LEFT PATELLA-FEMORAL ARTHROPLASTY;  Surgeon: Meredith Pel, MD;  Location: [place];  Service: Orthopedics;  Laterality: Left;   TUBAL LIGATION      There were no vitals filed for this visit.   Subjective Assessment - 07/31/20 1311     Subjective Pt stating she has been doing ice massage and soft tissue mobs to help with scarring. Pt stating they are picking up her CPM this week. She stated she raised it up to 120 degrees.    Pertinent History HTN, arthritis, back pain, depression    Limitations Walking    Diagnostic tests MRI: patellofemoral arthritis left knee    Patient Stated Goals Walk without pain, use my leg normally again    Currently in Pain? Yes    Pain Score 2     Pain Location Knee    Pain Orientation Left    Pain Descriptors / Indicators Sore;Tightness    Pain Type Surgical pain    Pain Onset More than a  month ago                [facility] PT Assessment - 07/31/20 0001       Assessment   Medical Diagnosis M17.12 Left TKA    Referring Provider (PT) Marcene Duos MD    Onset Date/Surgical Date 06/29/20                           [facility] Adult PT Treatment/Exercise - 07/31/20 0001       Exercises   Exercises Knee/Hip      Knee/Hip Exercises: Stretches   Gastroc Stretch 2 reps;30 seconds    Gastroc Stretch Limitations slant board      Knee/Hip Exercises: Aerobic   Nustep seat 12, L5 x 10 minutes      Knee/Hip Exercises: Machines for Strengthening   Cybex Leg Press double leg press: 100# 2x15, Left LE only: 50# 2x15      Knee/Hip Exercises: Standing   Lateral Step Up Limitations stepping on 4 inch step lifting up and down    Forward Step Up 10 reps;Hand Hold: 1;Step Height: 4"  Knee/Hip Exercises: Seated   Long Arc Quad Strengthening;Left;2 sets;10 reps;Weights    Long Arc Quad Weight 3 lbs.      Knee/Hip Exercises: Supine   Bridges Strengthening;Both;10 reps    Bridges Limitations holding 5 seconds    Straight Leg Raises Strengthening;Left;1 set;10 reps    Straight Leg Raises Limitations instructed to perform quad set and df prior to lifting      Modalities   Modalities Vasopneumatic      Vasopneumatic   Number Minutes Vasopneumatic  10 minutes    Vasopnuematic Location  Knee    Vasopneumatic Pressure Medium    Vasopneumatic Temperature  34      Manual Therapy   Manual Therapy Passive ROM    Passive ROM knee flexion                      PT Short Term Goals - 07/31/20 1340       PT SHORT TERM GOAL #1   Title Pt will be independent in her initial HEP.    Status Achieved      PT SHORT TERM GOAL #2   Title Pt will improve her 5 time sit to stand to </= 14 seconds.    Status On-going               PT Long Term Goals - 07/31/20 1340       PT LONG TERM GOAL #1   Title Pt will be independent in her advanced HEP.     Status On-going      PT LONG TERM GOAL #2   Title Pt will be able to amb without assistive device for community distances and surfaces with pain </= 2/10 in her left knee.    Status On-going      PT LONG TERM GOAL #3   Title Pt will improve her left knee active ROM to >/= 0-120 degrees.    Baseline 120 degrees actively on 07/31/2020.    Status Achieved      PT LONG TERM GOAL #4   Title Pt will be able to increase her left knee strength to >/= 4+/5 in order to improve gait and functional mobility.      PT LONG TERM GOAL #5   Title Pt will improve her FOTO from 43% to >/= 61%.    Status On-going                   Plan - 07/31/20 1316     Clinical Impression Statement Pt reproting compliance with self scar tissue mobilizations taught at last visit. Pt reporting 2/10 pain today upon arrival. Pt reportting compliane in her HEP. Pt tolerating exercises well progressing with ROM and strengthening. Instructed pt to work on [employer] and sit to stand to build strength quad strength to progress off her rolling walker. Next visit work on gait training progressing to straight cane if pt is able to perform 15-20 SLR on the left. Continue skilled PT to maximize function.    Personal Factors and Comorbidities Comorbidity 3+    Comorbidities anxiety, arthritis, headaches, allergies, pre-diabetic    Examination-Activity Limitations Lift;Squat;Stairs;Stand;Transfers;Other    Examination-Participation Restrictions Other;Occupation;Community Activity    Rehab Potential Good    PT Frequency 2x / week    PT Duration 8 weeks    PT Treatment/Interventions Electrical Stimulation;ADLs/Self Care Home Management;Gait training;Stair training;Functional mobility training;Therapeutic activities;Therapeutic exercise;Balance training;Neuromuscular re-education;Patient/family education;Passive range of motion;Manual techniques;Taping;Vasopneumatic Device    PT Next Visit  Plan Nustep, LE strengthening including  standing, left knee ROM, sit to stand, gait training progressing to st cane.    PT Home Exercise Plan Access Code: [email or web address] and Agree with Plan of Care Patient             Patient will benefit from skilled therapeutic intervention in order to improve the following deficits and impairments:  Pain, Decreased strength, Decreased mobility, Increased edema, Decreased balance, Impaired flexibility, Decreased activity tolerance, Difficulty walking, Decreased range of motion  Visit Diagnosis: Acute pain of left knee  Stiffness of left knee, not elsewhere classified  Localized edema  Difficulty in walking, not elsewhere classified     Problem List Patient Active Problem List   Diagnosis Date Noted   Status post left partial knee replacement 06/29/2020   Prediabetes 06/23/2020   Patellofemoral arthritis of left knee 04/04/2020   Chest pain 04/04/2020   Depressed mood 04/04/2020   Essential hypertension, benign 04/04/2020   Lipoma of lower extremity 04/04/2020   Tobacco use 04/04/2020    Oretha Caprice, PT, MPT 07/31/2020, 1:42 PM  [facility] Physical Therapy [address] [place], [place], [address] Phone: [phone]   Fax:  [phone]  Name: MERANDA VONDRA MRN: [id number] Date of Birth: 07-22-1980

## 2020-08-03 ENCOUNTER — Encounter: Payer: 59 | Admitting: Rehabilitative and Restorative Service Providers"

## 2020-08-03 ENCOUNTER — Telehealth: Payer: Self-pay | Admitting: Rehabilitative and Restorative Service Providers"

## 2020-08-03 NOTE — Telephone Encounter (Signed)
No show.  Voicemail full.  No additional appointments.

## 2020-08-10 ENCOUNTER — Other Ambulatory Visit: Payer: Self-pay | Admitting: Orthopedic Surgery

## 2020-08-10 ENCOUNTER — Ambulatory Visit (INDEPENDENT_AMBULATORY_CARE_PROVIDER_SITE_OTHER): Payer: 59 | Admitting: Orthopedic Surgery

## 2020-08-10 ENCOUNTER — Other Ambulatory Visit: Payer: Self-pay

## 2020-08-10 ENCOUNTER — Encounter: Payer: Self-pay | Admitting: Orthopedic Surgery

## 2020-08-10 DIAGNOSIS — M1712 Unilateral primary osteoarthritis, left knee: Secondary | ICD-10-CM

## 2020-08-10 NOTE — Progress Notes (Signed)
   Post-Op Visit Note   Patient: Nicole Deleon           Date of Birth: Mar 10, 1980           MRN: [id number] Visit Date: 08/10/2020 PCP: Carlena Hurl, PA-C   Assessment & Plan:  Chief Complaint:  Chief Complaint  Patient presents with   Left Knee - Routine Post Op   Visit Diagnoses:  1. Patellofemoral arthritis of left knee     Plan: Nicole Deleon is a 40 year old patient is now 6 weeks out left patellofemoral arthroplasty.  She has been doing well.  Taking ibuprofen for pain.  No calf tenderness negative Homans today.  Range of motion is excellent.  Patella tracking feels very smooth.  Plan at this time is continue with therapy for quad strengthening.  6-week return for clinical recheck and decision for or against return to work at that time.  Follow-Up Instructions: Return in about 6 weeks (around 09/21/2020).   Orders:  No orders of the defined types were placed in this encounter.  No orders of the defined types were placed in this encounter.   Imaging: No results found.  PMFS History: Patient Active Problem List   Diagnosis Date Noted   Status post left partial knee replacement 06/29/2020   Prediabetes 06/23/2020   Patellofemoral arthritis of left knee 04/04/2020   Chest pain 04/04/2020   Depressed mood 04/04/2020   Essential hypertension, benign 04/04/2020   Lipoma of lower extremity 04/04/2020   Tobacco use 04/04/2020   Past Medical History:  Diagnosis Date   Allergy    Anxiety    Arthritis    left knee   Chronic back pain    Depression    Headache    Hypertension    Pre-diabetes    diet controlled   SVD (spontaneous vaginal delivery)    x 3    Family History  Problem Relation Age of Onset   Hypertension Mother    Hypertension Father    Hyperlipidemia Father     Past Surgical History:  Procedure Laterality Date   no previous surgeries     PATELLA-FEMORAL ARTHROPLASTY Left 06/29/2020   Procedure: LEFT PATELLA-FEMORAL ARTHROPLASTY;  Surgeon:  Meredith Pel, MD;  Location: [place];  Service: Orthopedics;  Laterality: Left;   TUBAL LIGATION     Social History   Occupational History   Not on file  Tobacco Use   Smoking status: Former    Packs/day: 0.50    Years: 12.00    Pack years: 6.00    Types: Cigarettes    Quit date: 02/08/2020    Years since quitting: 0.5   Smokeless tobacco: Never   Tobacco comments:    Pt states had last cigarette yesterday 06/27/20.  Vaping Use   Vaping Use: Former  Substance and Sexual Activity   Alcohol use: Yes    Alcohol/week: 3.0 standard drinks    Types: 3 Standard drinks or equivalent per week    Comment: occassional liquor   Drug use: Not Currently    Types: Marijuana    Comment: Last use was on 06/23/20   Sexual activity: Not Currently    Birth control/protection: None    Comment: Tubal Ligation

## 2020-08-14 ENCOUNTER — Ambulatory Visit (INDEPENDENT_AMBULATORY_CARE_PROVIDER_SITE_OTHER): Payer: 59 | Admitting: Medical

## 2020-08-14 ENCOUNTER — Other Ambulatory Visit: Payer: Self-pay

## 2020-08-14 ENCOUNTER — Encounter: Payer: Self-pay | Admitting: Medical

## 2020-08-14 VITALS — BP 130/90 | HR 86 | Wt 267.6 lb

## 2020-08-14 DIAGNOSIS — Z8249 Family history of ischemic heart disease and other diseases of the circulatory system: Secondary | ICD-10-CM

## 2020-08-14 DIAGNOSIS — R7303 Prediabetes: Secondary | ICD-10-CM

## 2020-08-14 DIAGNOSIS — I10 Essential (primary) hypertension: Secondary | ICD-10-CM

## 2020-08-14 DIAGNOSIS — Z720 Tobacco use: Secondary | ICD-10-CM | POA: Diagnosis not present

## 2020-08-14 DIAGNOSIS — R4589 Other symptoms and signs involving emotional state: Secondary | ICD-10-CM | POA: Diagnosis not present

## 2020-08-14 DIAGNOSIS — M1712 Unilateral primary osteoarthritis, left knee: Secondary | ICD-10-CM | POA: Diagnosis not present

## 2020-08-14 MED ORDER — VALSARTAN-HYDROCHLOROTHIAZIDE 160-12.5 MG PO TABS: 1.0000 | ORAL_TABLET | Freq: Every day | ORAL | 1 refills | Status: DC

## 2020-08-14 MED ORDER — BUPROPION HCL ER (XL) 300 MG PO TB24
300.0000 mg | ORAL_TABLET | Freq: Every day | ORAL | 0 refills | Status: DC
Start: 2020-08-14 — End: 2020-11-13

## 2020-08-14 NOTE — Progress Notes (Signed)
Subjective:  Nicole Deleon is a 40 y.o. female who presents for Chief Complaint  Patient presents with   med check    Med check, needs refills. Would like to be rechecked for pre diabetes     Here for med check.  Last visit she was a new patient.  Last visit we discussed referral to counseling.  Hypertension - last visit she restarted hydrochlorothiazide.  Checks BP sometimes.  Only has ever been on HCTZ.  First diagnosed 3 years ago.   No prior echocardiogram, no prior stress test.  Dad was recommended stents and one point, but he ended  up having 2 massive MIs and died.  Father likely diagnosed around 7 with heart disease.    Impaired glucose-eats a lot of chicken and salads.  Does drink soda.   Uses wheat bread or brown rice.     Still smoking, using wellbutrin to help with tobacco and to help with mood.   Mood has been somewhat down of late.  Is in PT for knee surgery.  Still has bout 6 more PT sessions s/p left knee surgery in recent months.  No other aggravating or relieving factors.    No other c/o.  Past Medical History:  Diagnosis Date   Allergy    Anxiety    Arthritis    left knee   Chronic back pain    Depression    Headache    Hypertension    Pre-diabetes    diet controlled   SVD (spontaneous vaginal delivery)    x 3   Current Outpatient Medications on File Prior to Visit  Medication Sig Dispense Refill   acetaminophen (TYLENOL) 500 MG tablet Take 500 mg by mouth 3 (three) times daily as needed for moderate pain.     aspirin 81 MG chewable tablet Chew 1 tablet (81 mg total) by mouth daily. 30 tablet 0   Multiple Vitamin (MULTIVITAMIN WITH MINERALS) TABS tablet Take 1 tablet by mouth daily.     No current facility-administered medications on file prior to visit.   Family History  Problem Relation Age of Onset   Hypertension Mother    Hypertension Father    Hyperlipidemia Father      The following portions of the patient's history were reviewed  and updated as appropriate: allergies, current medications, past family history, past medical history, past social history, past surgical history and problem list.  ROS Otherwise as in subjective above    Objective: BP 130/90   Pulse 86   Wt 267 lb 9.6 oz (121.4 kg)   BMI 37.32 kg/m   BP Readings from Last 3 Encounters:  08/14/20 130/90  06/30/20 119/75  06/28/20 (!) 127/103   Wt Readings from Last 3 Encounters:  08/14/20 267 lb 9.6 oz (121.4 kg)  06/29/20 282 lb 3 oz (128 kg)  06/23/20 282 lb 1.6 oz (128 kg)   General appearance: alert, no distress, well developed, well nourished Neck: supple, no lymphadenopathy, no thyromegaly, no masses, no bruits Heart: RRR, normal S1, S2, no murmurs Lungs: CTA bilaterally, no wheezes, rhonchi, or rales Pulses: 2+ radial pulses, 2+ pedal pulses, normal cap refill Ext: no edema Left knee surgical scar, and she is guarded of the left knee s/p recent surgery   Assessment: Encounter Diagnoses  Name Primary?   Essential hypertension, benign Yes   Tobacco use    Depressed mood    Patellofemoral arthritis of left knee    Prediabetes    Family history of  premature CAD      Plan: Hypertension-changed to valsartan HCT.  Discussed proper use of medicine, risk and benefits of medication.  We discussed risk of uncontrolled hypertension.  Tobacco use-continue Wellbutrin but increase dose to 300 mg daily XL  Depressed mood-continue Wellbutrin but increase to '300mg'$  XL daily  Impaired glucose-recent hemoglobin A1c 5.8%, glucose elevated.  We spent considerable time today discussing healthy diet and exercise, reducing sugars in her diet and trying to prevent progression to diabetes  We discussed her risk factors for heart disease including family history.  She will return at her convenience for fasting physical and to get a baseline lipid panel  Recent left knee surgery, patellofemoral arthritis-follow-up with orthopedics  [name] was seen  today for med check.  Diagnoses and all orders for this visit:  Essential hypertension, benign  Tobacco use  Depressed mood  Patellofemoral arthritis of left knee  Prediabetes  Family history of premature CAD  Other orders -     valsartan-hydrochlorothiazide (DIOVAN-HCT) 160-12.5 MG tablet; Take 1 tablet by mouth daily. -     buPROPion (WELLBUTRIN XL) 300 MG 24 hr tablet; Take 1 tablet (300 mg total) by mouth daily.   Follow up: schedule fasting physical in the next 4-6 months

## 2020-08-28 ENCOUNTER — Encounter: Payer: Self-pay | Admitting: Internal Medicine

## 2020-08-29 ENCOUNTER — Encounter: Payer: Self-pay | Admitting: Physical Therapy

## 2020-08-29 ENCOUNTER — Ambulatory Visit (INDEPENDENT_AMBULATORY_CARE_PROVIDER_SITE_OTHER): Payer: 59 | Admitting: Physical Therapy

## 2020-08-29 ENCOUNTER — Other Ambulatory Visit: Payer: Self-pay

## 2020-08-29 DIAGNOSIS — M25562 Pain in left knee: Secondary | ICD-10-CM

## 2020-08-29 DIAGNOSIS — R600 Localized edema: Secondary | ICD-10-CM

## 2020-08-29 DIAGNOSIS — M25662 Stiffness of left knee, not elsewhere classified: Secondary | ICD-10-CM | POA: Diagnosis not present

## 2020-08-29 DIAGNOSIS — R262 Difficulty in walking, not elsewhere classified: Secondary | ICD-10-CM

## 2020-08-29 NOTE — Therapy (Signed)
[facility] [address] [place], [place], [address] Phone: [phone]   Fax:  [phone]  Physical Therapy Treatment  Patient Details  Name: Nicole Deleon MRN: [id number] Date of Birth: 1980-10-12 Referring Provider (PT): Marcene Duos MD   Encounter Date: 08/29/2020   PT End of Session - 08/29/20 1345     Visit Number 5    Number of Visits 16    Date for PT Re-Evaluation 09/15/20    Progress Note Due on Visit 10    PT Start Time 1300    PT Stop Time 1340    PT Time Calculation (min) 40 min    Equipment Utilized During Treatment Gait belt    Activity Tolerance Patient limited by fatigue;Patient limited by pain    Behavior During Therapy WFL for tasks assessed/performed             Past Medical History:  Diagnosis Date   Allergy    Anxiety    Arthritis    left knee   Chronic back pain    Depression    Headache    Hypertension    Pre-diabetes    diet controlled   SVD (spontaneous vaginal delivery)    x 3    Past Surgical History:  Procedure Laterality Date   no previous surgeries     PATELLA-FEMORAL ARTHROPLASTY Left 06/29/2020   Procedure: LEFT PATELLA-FEMORAL ARTHROPLASTY;  Surgeon: Meredith Pel, MD;  Location: [facility];  Service: Orthopedics;  Laterality: Left;   TUBAL LIGATION      There were no vitals filed for this visit.   Subjective Assessment - 08/29/20 1314     Subjective Pt arriving stating 3/10 pain in left knee. Amb into clinic with st cane.    Pertinent History HTN, arthritis, back pain, depression    Limitations Walking    Diagnostic tests MRI: patellofemoral arthritis left knee    Currently in Pain? Yes    Pain Score 3     Pain Location Knee    Pain Orientation Left    Pain Descriptors / Indicators Sore    Pain Type Surgical pain    Pain Onset More than a month ago                [facility] PT Assessment - 08/29/20 0001       Assessment   Medical Diagnosis M17.12 Left TKA     Referring Provider (PT) Marcene Duos MD    Onset Date/Surgical Date 06/29/20      AROM   Overall AROM Comments supine    AROM Assessment Site Knee    Right/Left Knee Left    Left Knee Extension 0    Left Knee Flexion 128      Transfers   Five time sit to stand comments  11 seconds with intermittent UE support      Ambulation/Gait   Gait Comments amb with st cane instructed in heel to toe gait pattern with push off                           [facility] Adult PT Treatment/Exercise - 08/29/20 0001       Exercises   Exercises Knee/Hip      Knee/Hip Exercises: Stretches   Gastroc Stretch 2 reps;30 seconds    Gastroc Stretch Limitations slant board      Knee/Hip Exercises: Aerobic   Nustep seat 12, L5 x 10 minutes  Knee/Hip Exercises: Machines for Strengthening   Cybex Leg Press double leg press: 106# 2x15, Left LE only: 56# 2x15      Knee/Hip Exercises: Standing   Functional Squat Limitations mini squats in parallel bars x 15    Other Standing Knee Exercises sit to stand x 10      Knee/Hip Exercises: Seated   Long Arc Quad Strengthening;Left;2 sets;10 reps;Weights    Long Arc Quad Weight 4 lbs.    Sit to [employer] 5 reps      Knee/Hip Exercises: Supine   Bridges Strengthening;Both;10 reps;2 sets    Bridges Limitations holding 5 seconds    Straight Leg Raises Strengthening;Left;1 set;10 reps    Straight Leg Raises Limitations instructed to perform quad set and df prior to lifting      Modalities   Modalities --      Vasopneumatic   Number Minutes Vasopneumatic  --    Vasopnuematic Location  --    Vasopneumatic Pressure --    Vasopneumatic Temperature  --      Manual Therapy   Manual Therapy --    Passive ROM --                      PT Short Term Goals - 08/29/20 1329       PT SHORT TERM GOAL #1   Title Pt will be independent in her initial HEP.    Status Achieved      PT SHORT TERM GOAL #2   Title Pt will improve her 5 time sit to  stand to </= 14 seconds.    Baseline 11 seconds with intermittent UE support    Status Achieved               PT Long Term Goals - 07/31/20 1340       PT LONG TERM GOAL #1   Title Pt will be independent in her advanced HEP.    Status On-going      PT LONG TERM GOAL #2   Title Pt will be able to amb without assistive device for community distances and surfaces with pain </= 2/10 in her left knee.    Status On-going      PT LONG TERM GOAL #3   Title Pt will improve her left knee active ROM to >/= 0-120 degrees.    Baseline 120 degrees actively on 07/31/2020.    Status Achieved      PT LONG TERM GOAL #4   Title Pt will be able to increase her left knee strength to >/= 4+/5 in order to improve gait and functional mobility.      PT LONG TERM GOAL #5   Title Pt will improve her FOTO from 43% to >/= 61%.    Status On-going                   Plan - 08/29/20 1321     Clinical Impression Statement Pt reporting 3/10 pain in her left knee upon arrival. Pt stating that her CPM was picked up last week. Pt tolerating exercises well with decreased extensor lag noted upon initial SLR on the left. Pt amb with straight cane with step through gait pattern. Continue skilled PT to maximize function.    Personal Factors and Comorbidities Comorbidity 3+    Comorbidities anxiety, arthritis, headaches, allergies, pre-diabetic    Examination-Activity Limitations Lift;Squat;Stairs;Stand;Transfers;Other    Examination-Participation Restrictions Other;Occupation;Community Activity    Stability/Clinical Decision Making Stable/Uncomplicated  Rehab Potential Good    PT Frequency 2x / week    PT Duration 8 weeks    PT Treatment/Interventions Electrical Stimulation;ADLs/Self Care Home Management;Gait training;Stair training;Functional mobility training;Therapeutic activities;Therapeutic exercise;Balance training;Neuromuscular re-education;Patient/family education;Passive range of motion;Manual  techniques;Taping;Vasopneumatic Device    PT Next Visit Plan Bike,  LE strengthening including standing, left knee ROM    PT Home Exercise Plan Access Code: [email or web address]    Consulted and Agree with Plan of Care Patient             Patient will benefit from skilled therapeutic intervention in order to improve the following deficits and impairments:  Pain, Decreased strength, Decreased mobility, Increased edema, Decreased balance, Impaired flexibility, Decreased activity tolerance, Difficulty walking, Decreased range of motion  Visit Diagnosis: Acute pain of left knee  Stiffness of left knee, not elsewhere classified  Localized edema  Difficulty in walking, not elsewhere classified     Problem List Patient Active Problem List   Diagnosis Date Noted   Family history of premature CAD 08/14/2020   Status post left partial knee replacement 06/29/2020   Prediabetes 06/23/2020   Patellofemoral arthritis of left knee 04/04/2020   Chest pain 04/04/2020   Depressed mood 04/04/2020   Essential hypertension, benign 04/04/2020   Lipoma of lower extremity 04/04/2020   Tobacco use 04/04/2020    Oretha Caprice , PT, MPT 08/29/2020, 1:46 PM  [facility] [address] [place], [place], [address] Phone: [phone]   Fax:  [phone]  Name: KASSIANI RITENOUR MRN: [id number] Date of Birth: 05-12-1980

## 2020-09-18 ENCOUNTER — Telehealth: Payer: Self-pay | Admitting: Orthopedic Surgery

## 2020-09-29 ENCOUNTER — Other Ambulatory Visit: Payer: Self-pay

## 2020-09-29 ENCOUNTER — Ambulatory Visit: Payer: Self-pay

## 2020-09-29 ENCOUNTER — Ambulatory Visit (INDEPENDENT_AMBULATORY_CARE_PROVIDER_SITE_OTHER): Payer: 59 | Admitting: Surgical

## 2020-09-29 ENCOUNTER — Encounter: Payer: Self-pay | Admitting: Surgical

## 2020-09-29 DIAGNOSIS — M1712 Unilateral primary osteoarthritis, left knee: Secondary | ICD-10-CM

## 2020-09-29 DIAGNOSIS — M25562 Pain in left knee: Secondary | ICD-10-CM | POA: Diagnosis not present

## 2020-09-29 DIAGNOSIS — G8929 Other chronic pain: Secondary | ICD-10-CM

## 2020-09-29 MED ORDER — GABAPENTIN 100 MG PO CAPS: 100.0000 mg | ORAL_CAPSULE | Freq: Three times a day (TID) | ORAL | 1 refills | Status: DC

## 2020-09-29 NOTE — Progress Notes (Signed)
Post-Op Visit Note   Patient: Nicole Deleon           Date of Birth: [birth year]           MRN: [id number] Visit Date: 09/29/2020 PCP: Carlena Hurl, PA-C   Assessment & Plan:  Chief Complaint: No chief complaint on file.  Visit Diagnoses:  1. Chronic pain of left knee   2. Patellofemoral arthritis of left knee     Plan: Patient is a 40 year old female who presents s/p patellofemoral replacement of the left knee.  She is about 12 weeks out from surgery.  She is in physical therapy still but has not been in several weeks.  She has next appointment on Tuesday.  She is doing home exercise program but reports that she is not as compliant with this as she could be..  Reports mostly lateral pain relative to the incision with some increased sensitivity and numbness and tingling sensation.  She is taking occasional Tylenol and muscle relaxer.  Has not returned to work yet.  Denies any fevers, chills, night sweats, malaise.  She works as Freight forwarder at [employer]..  On exam she has increased sensitivity over the lateral aspect of her knee lateral to the incision.  Incision is healed well without any evidence of infection or dehiscence.  No effusion.  She is able to perform straight leg raise without difficulty.  She extends to 1 to 2 degrees relative to the 3 degree hyperextension on the contralateral side.  No evidence of patellar subluxation with motion of the left knee.  Range of motion from 0 to 115 degrees.  She does have occasional complaint of patellar subluxation but the knee never gives out on her.  Recommended she focus on achieving as much extension as she can given the hypertension on her other side and work on quadricep strengthening exercises to minimize any chance of patellar subluxation/instability events.  Radiographs taken today appear comparable to postoperative films.  Plan to try gabapentin for likely neuropathic pain with nerve regeneration following surgery.  Follow-up in 6 weeks  for clinical recheck.  She will return to work for 6-hour shifts..    Follow-Up Instructions: No follow-ups on file.   Orders:  No orders of the defined types were placed in this encounter.  No orders of the defined types were placed in this encounter.   Imaging: No results found.  PMFS History: Patient Active Problem List   Diagnosis Date Noted   Family history of premature CAD 08/14/2020   Status post left partial knee replacement 06/29/2020   Prediabetes 06/23/2020   Patellofemoral arthritis of left knee 04/04/2020   Chest pain 04/04/2020   Depressed mood 04/04/2020   Essential hypertension, benign 04/04/2020   Lipoma of lower extremity 04/04/2020   Tobacco use 04/04/2020   Past Medical History:  Diagnosis Date   Allergy    Anxiety    Arthritis    left knee   Chronic back pain    Depression    Headache    Hypertension    Pre-diabetes    diet controlled   SVD (spontaneous vaginal delivery)    x 3    Family History  Problem Relation Age of Onset   Hypertension Mother    Hypertension Father    Hyperlipidemia Father     Past Surgical History:  Procedure Laterality Date   no previous surgeries     PATELLA-FEMORAL ARTHROPLASTY Left 06/29/2020   Procedure: LEFT PATELLA-FEMORAL ARTHROPLASTY;  Surgeon: Marcene Duos  Nicki Reaper, MD;  Location: [facility];  Service: Orthopedics;  Laterality: Left;   TUBAL LIGATION     Social History   Occupational History   Not on file  Tobacco Use   Smoking status: Former    Packs/day: 0.50    Years: 12.00    Pack years: 6.00    Types: Cigarettes    Quit date: 02/08/2020    Years since quitting: 0.6   Smokeless tobacco: Never   Tobacco comments:    Pt states had last cigarette yesterday 06/27/20.  Vaping Use   Vaping Use: Former  Substance and Sexual Activity   Alcohol use: Yes    Alcohol/week: 3.0 standard drinks    Types: 3 Standard drinks or equivalent per week    Comment: occassional liquor   Drug use: Not Currently     Types: Marijuana    Comment: Last use was on 06/23/20   Sexual activity: Not Currently    Birth control/protection: None    Comment: Tubal Ligation

## 2020-10-03 ENCOUNTER — Encounter: Payer: Self-pay | Admitting: Physical Therapy

## 2020-10-03 ENCOUNTER — Ambulatory Visit (INDEPENDENT_AMBULATORY_CARE_PROVIDER_SITE_OTHER): Payer: 59 | Admitting: Physical Therapy

## 2020-10-03 ENCOUNTER — Other Ambulatory Visit: Payer: Self-pay

## 2020-10-03 DIAGNOSIS — M25562 Pain in left knee: Secondary | ICD-10-CM

## 2020-10-03 DIAGNOSIS — R600 Localized edema: Secondary | ICD-10-CM

## 2020-10-03 DIAGNOSIS — R262 Difficulty in walking, not elsewhere classified: Secondary | ICD-10-CM

## 2020-10-03 DIAGNOSIS — M25662 Stiffness of left knee, not elsewhere classified: Secondary | ICD-10-CM | POA: Diagnosis not present

## 2020-10-03 NOTE — Therapy (Signed)
[facility] [address] [place], [place], [address] Phone: [phone]   Fax:  [phone]  Physical Therapy Treatment Progress note/ Recert  Patient Details  Name: Nicole Deleon MRN: [id number] Date of Birth: 04-May-1980 Referring Provider (PT): Marcene Duos MD  Progress Note Reporting Period 07/17/2020 to 10/03/2020   See note below for Objective Data and Assessment of Progress/Goals.      Encounter Date: 10/03/2020   PT End of Session - 10/03/20 1007     Visit Number 6    Number of Visits 16    Date for PT Re-Evaluation 11/24/20    Authorization Type PN/recert sent on 05/05/7679 for 6 more weeks 1x/week    Progress Note Due on Visit 16    PT Start Time 0930    PT Stop Time 1010    PT Time Calculation (min) 40 min    Equipment Utilized During Treatment Gait belt    Activity Tolerance Patient limited by fatigue;Patient limited by pain    Behavior During Therapy WFL for tasks assessed/performed             Past Medical History:  Diagnosis Date   Allergy    Anxiety    Arthritis    left knee   Chronic back pain    Depression    Headache    Hypertension    Pre-diabetes    diet controlled   SVD (spontaneous vaginal delivery)    x 3    Past Surgical History:  Procedure Laterality Date   no previous surgeries     PATELLA-FEMORAL ARTHROPLASTY Left 06/29/2020   Procedure: LEFT PATELLA-FEMORAL ARTHROPLASTY;  Surgeon: Meredith Pel, MD;  Location: [facility];  Service: Orthopedics;  Laterality: Left;   TUBAL LIGATION      There were no vitals filed for this visit.   Subjective Assessment - 10/03/20 1003     Subjective Pt arriving with 3/10 pain in her left knee. Pt stating she has started back to work and has been reporting more soreness.    Pertinent History HTN, arthritis, back pain, depression    Diagnostic tests MRI: patellofemoral arthritis left knee    Patient Stated Goals Walk without pain, use my leg normally  again    Currently in Pain? Yes    Pain Score 3     Pain Location Knee    Pain Orientation Left    Pain Descriptors / Indicators Sore    Pain Onset More than a month ago                [facility] PT Assessment - 10/03/20 0001       Assessment   Medical Diagnosis M17.12 Left TKA    Referring Provider (PT) Marcene Duos MD    Onset Date/Surgical Date 06/29/20      AROM   Overall AROM Comments supine    AROM Assessment Site Knee    Right/Left Knee Left    Left Knee Extension 0    Left Knee Flexion 128      Ambulation/Gait   Gait Comments amb with no device                           [facility] Adult PT Treatment/Exercise - 10/03/20 0001       Neuro Re-ed    Neuro Re-ed Details  SLS: with intermittent UE support on TM bar, SLS on Airex with intermittent support on TM bar  Exercises   Exercises Knee/Hip      Knee/Hip Exercises: Stretches   Active Hamstring Stretch Left;3 reps;30 seconds    Quad Stretch Left;3 reps;30 seconds      Knee/Hip Exercises: Aerobic   Recumbent Bike L4 x 8 minutes      Knee/Hip Exercises: Machines for Strengthening   Cybex Leg Press double leg press: 112# 2x15, Left LE only: 56# 2x15      Knee/Hip Exercises: Standing   Other Standing Knee Exercises TRX squats to pt's tolreance 2x10.      Knee/Hip Exercises: Seated   Long Arc Quad Strengthening;Left;2 sets;10 reps;Weights    Long Arc Quad Weight 5 lbs.                     PT Education - 10/03/20 1006     Education Details Gait training, pt instructed to widen her base of support with heel strike with left LE. No device needed.    Person(s) Educated Patient    Methods Explanation;Demonstration;Verbal cues    Comprehension Verbalized understanding;Returned demonstration              PT Short Term Goals - 08/29/20 1329       PT SHORT TERM GOAL #1   Title Pt will be independent in her initial HEP.    Status Achieved      PT SHORT TERM GOAL #2   Title  Pt will improve her 5 time sit to stand to </= 14 seconds.    Baseline 11 seconds with intermittent UE support    Status Achieved               PT Long Term Goals - 10/03/20 1017       PT LONG TERM GOAL #1   Title Pt will be independent in her advanced HEP.    Status On-going      PT LONG TERM GOAL #2   Title Pt will be able to amb without assistive device for community distances and surfaces with pain </= 2/10 in her left knee.    Status Achieved      PT LONG TERM GOAL #3   Title Pt will improve her left knee active ROM to >/= 0-120 degrees.    Baseline 0-126 on 10/03/2020    Status Achieved      PT LONG TERM GOAL #4   Title Pt will be able to increase her left knee strength to >/= 4+/5 in order to improve gait and functional mobility.    Status On-going      PT LONG TERM GOAL #5   Title Pt will improve her FOTO from 43% to >/= 61%.    Status On-going                   Plan - 10/03/20 1013     Clinical Impression Statement Pt arriving today reporting returning to work and her pain and swelling have increased. Pt was instructed to contiue to ice and elevate along with her daily HEP. Pt was instructed in wider BOS during amb with initiation of left heel to toe gait pattern. Pt's ROM arc today was 0-126 degress in left knee. I am requesting 6 visits over the next 6 weeks to continue to focus on quad strengthening, edurance and dynamic balance.    Personal Factors and Comorbidities Comorbidity 3+    Comorbidities anxiety, arthritis, headaches, allergies, pre-diabetic    Examination-Activity Limitations Lift;Squat;Stairs;Stand;Transfers;Other    Examination-Participation Restrictions Other;Occupation;Community  Activity    Stability/Clinical Decision Making Stable/Uncomplicated    Rehab Potential Good    PT Frequency 1x / week    PT Duration 6 weeks    PT Treatment/Interventions Electrical Stimulation;ADLs/Self Care Home Management;Gait training;Stair  training;Functional mobility training;Therapeutic activities;Therapeutic exercise;Balance training;Neuromuscular re-education;Patient/family education;Passive range of motion;Manual techniques;Taping;Vasopneumatic Device    PT Next Visit Plan Bike,  LE strengthening including standing, dynamic balance, stairs    PT Home Exercise Plan Access Code: [email or web address]    Consulted and Agree with Plan of Care Patient             Patient will benefit from skilled therapeutic intervention in order to improve the following deficits and impairments:  Pain, Decreased strength, Decreased mobility, Increased edema, Decreased balance, Impaired flexibility, Decreased activity tolerance, Difficulty walking, Decreased range of motion  Visit Diagnosis: Acute pain of left knee  Stiffness of left knee, not elsewhere classified  Localized edema  Difficulty in walking, not elsewhere classified     Problem List Patient Active Problem List   Diagnosis Date Noted   Family history of premature CAD 08/14/2020   Status post left partial knee replacement 06/29/2020   Prediabetes 06/23/2020   Patellofemoral arthritis of left knee 04/04/2020   Chest pain 04/04/2020   Depressed mood 04/04/2020   Essential hypertension, benign 04/04/2020   Lipoma of lower extremity 04/04/2020   Tobacco use 04/04/2020    Oretha Caprice, PT, MPT 10/03/2020, 10:32 AM  [facility] Therapy [address] [place], [place], [address] Phone: [phone]   Fax:  [phone]  Name: Nicole Deleon MRN: [id number] Date of Birth: [birth year]

## 2020-10-10 ENCOUNTER — Ambulatory Visit (INDEPENDENT_AMBULATORY_CARE_PROVIDER_SITE_OTHER): Payer: 59 | Admitting: Physical Therapy

## 2020-10-10 ENCOUNTER — Encounter: Payer: Self-pay | Admitting: Physical Therapy

## 2020-10-10 ENCOUNTER — Other Ambulatory Visit: Payer: Self-pay

## 2020-10-10 DIAGNOSIS — R600 Localized edema: Secondary | ICD-10-CM

## 2020-10-10 DIAGNOSIS — M25562 Pain in left knee: Secondary | ICD-10-CM | POA: Diagnosis not present

## 2020-10-10 DIAGNOSIS — R262 Difficulty in walking, not elsewhere classified: Secondary | ICD-10-CM | POA: Diagnosis not present

## 2020-10-10 DIAGNOSIS — M25662 Stiffness of left knee, not elsewhere classified: Secondary | ICD-10-CM

## 2020-10-10 NOTE — Therapy (Signed)
[facility] [place] Physical Therapy [address] [place], [place], [address] Phone: [phone]   Fax:  [phone]  Physical Therapy Treatment  Patient Details  Name: Nicole Deleon MRN: [id number] Date of Birth: 1980-10-11 Referring Provider (PT): Marcene Duos MD   Encounter Date: 10/10/2020   PT End of Session - 10/10/20 0956     Visit Number 7    Number of Visits 16    Date for PT Re-Evaluation 11/24/20    Authorization Type PN/recert sent on 08/24/5629 for 6 more weeks 1x/week    Progress Note Due on Visit 16    PT Start Time 0951    PT Stop Time 1015    PT Time Calculation (min) 24 min    Equipment Utilized During Treatment Gait belt    Activity Tolerance Patient limited by fatigue;Patient limited by pain    Behavior During Therapy WFL for tasks assessed/performed             Past Medical History:  Diagnosis Date   Allergy    Anxiety    Arthritis    left knee   Chronic back pain    Depression    Headache    Hypertension    Pre-diabetes    diet controlled   SVD (spontaneous vaginal delivery)    x 3    Past Surgical History:  Procedure Laterality Date   no previous surgeries     PATELLA-FEMORAL ARTHROPLASTY Left 06/29/2020   Procedure: LEFT PATELLA-FEMORAL ARTHROPLASTY;  Surgeon: Meredith Pel, MD;  Location: [place];  Service: Orthopedics;  Laterality: Left;   TUBAL LIGATION      There were no vitals filed for this visit.   Subjective Assessment - 10/10/20 0954     Subjective Pt arriving 20 minutes late to therapy today. Pt was given the option of rescheduleing or taking a shorter visit. Pt chose to go ahead with therapy session.    Pertinent History HTN, arthritis, back pain, depression    Diagnostic tests MRI: patellofemoral arthritis left knee    Patient Stated Goals Walk without pain, use my leg normally again    Currently in Pain? Yes    Pain Score 8     Pain Location Knee    Pain Orientation Left    Pain Descriptors /  Indicators Aching    Pain Type Surgical pain    Pain Onset More than a month ago                [facility] PT Assessment - 10/10/20 0001       Assessment   Medical Diagnosis M17.12 Left TKA    Referring Provider (PT) Marcene Duos MD    Onset Date/Surgical Date 06/29/20      AROM   Overall AROM Comments supine    AROM Assessment Site Knee    Right/Left Knee Left    Left Knee Extension 0    Left Knee Flexion 125                           [facility] Adult PT Treatment/Exercise - 10/10/20 0001       Neuro Re-ed    Neuro Re-ed Details  SLS Airex x 3 minutes with intermittnet UE support, side stepping, braiding front/back 20 feet x 4      Exercises   Exercises Knee/Hip      Knee/Hip Exercises: Stretches   Gastroc Stretch Both;30 seconds;2 reps      Knee/Hip  Exercises: Aerobic   Recumbent Bike L4 x 5 minutes      Knee/Hip Exercises: Machines for Strengthening   Cybex Leg Press double leg press: 112# 2x15, Left LE only: 56# 2x15      Knee/Hip Exercises: Standing   Forward Step Up 20 reps;Step Height: 6";Hand Hold: 1    Forward Step Up Limitations step downs x 20 left LE    Other Standing Knee Exercises TRX squats x 10      Knee/Hip Exercises: Seated   Long Arc Quad Strengthening;Left;2 sets;10 reps;Weights    Long Arc Quad Weight 5 lbs.      Knee/Hip Exercises: Supine   Heel Slides AROM;Left;10 reps      Manual Therapy   Manual therapy comments STM to left quad and lateral knee x 5 minutes                       PT Short Term Goals - 08/29/20 1329       PT SHORT TERM GOAL #1   Title Pt will be independent in her initial HEP.    Status Achieved      PT SHORT TERM GOAL #2   Title Pt will improve her 5 time sit to stand to </= 14 seconds.    Baseline 11 seconds with intermittent UE support    Status Achieved               PT Long Term Goals - 10/03/20 1017       PT LONG TERM GOAL #1   Title Pt will be independent in her  advanced HEP.    Status On-going      PT LONG TERM GOAL #2   Title Pt will be able to amb without assistive device for community distances and surfaces with pain </= 2/10 in her left knee.    Status Achieved      PT LONG TERM GOAL #3   Title Pt will improve her left knee active ROM to >/= 0-120 degrees.    Baseline 0-126 on 10/03/2020    Status Achieved      PT LONG TERM GOAL #4   Title Pt will be able to increase her left knee strength to >/= 4+/5 in order to improve gait and functional mobility.    Status On-going      PT LONG TERM GOAL #5   Title Pt will improve her FOTO from 43% to >/= 61%.    Status On-going                   Plan - 10/10/20 0956     Clinical Impression Statement Pt reproting increased pain over the last 2 days of 8/10. Pt stating she had touble sleeping last night and is having a "rough day". Treatment focusing on quad strengtheing and dynamic balance as well as soft tissue to medial quad to reduce pain. Continue skilled PT to maximize pt's function.    Personal Factors and Comorbidities Comorbidity 3+    Comorbidities anxiety, arthritis, headaches, allergies, pre-diabetic    Examination-Activity Limitations Lift;Squat;Stairs;Stand;Transfers;Other    Examination-Participation Restrictions Other;Occupation;Community Activity    Stability/Clinical Decision Making Stable/Uncomplicated    Rehab Potential Good    PT Frequency 1x / week    PT Duration 6 weeks    PT Treatment/Interventions Electrical Stimulation;ADLs/Self Care Home Management;Gait training;Stair training;Functional mobility training;Therapeutic activities;Therapeutic exercise;Balance training;Neuromuscular re-education;Patient/family education;Passive range of motion;Manual techniques;Taping;Vasopneumatic Device    PT Next Visit Plan Continue  with Bike,  LE strengthening including standing, dynamic balance, stairs    PT Home Exercise Plan Access Code: [email or web address]    Consulted and Agree with  Plan of Care Patient             Patient will benefit from skilled therapeutic intervention in order to improve the following deficits and impairments:  Pain, Decreased strength, Decreased mobility, Increased edema, Decreased balance, Impaired flexibility, Decreased activity tolerance, Difficulty walking, Decreased range of motion  Visit Diagnosis: Acute pain of left knee  Stiffness of left knee, not elsewhere classified  Localized edema  Difficulty in walking, not elsewhere classified     Problem List Patient Active Problem List   Diagnosis Date Noted   Family history of premature CAD 08/14/2020   Status post left partial knee replacement 06/29/2020   Prediabetes 06/23/2020   Patellofemoral arthritis of left knee 04/04/2020   Chest pain 04/04/2020   Depressed mood 04/04/2020   Essential hypertension, benign 04/04/2020   Lipoma of lower extremity 04/04/2020   Tobacco use 04/04/2020    Oretha Caprice, PT, MPT 10/10/2020, 10:55 AM  [facility] Physical Therapy [address] [place], [place], [address] Phone: [phone]   Fax:  [phone]  Name: Nicole Deleon MRN: [id number] Date of Birth: [birth year]

## 2020-10-17 ENCOUNTER — Encounter: Payer: 59 | Admitting: Physical Therapy

## 2020-10-17 ENCOUNTER — Telehealth: Payer: Self-pay | Admitting: Physical Therapy

## 2020-10-17 NOTE — Telephone Encounter (Signed)
I called to f/u with pt after her missed 9:30 am appointment today. I reminded her of her upcoming appointment on 10/31/2020 at 9:30 am. Kearney Hard, PT, MPT 10/17/20 9:52 AM

## 2020-10-31 ENCOUNTER — Encounter: Payer: 59 | Admitting: Physical Therapy

## 2020-11-07 ENCOUNTER — Other Ambulatory Visit: Payer: Self-pay

## 2020-11-07 ENCOUNTER — Encounter: Payer: Self-pay | Admitting: Physical Therapy

## 2020-11-07 ENCOUNTER — Ambulatory Visit (INDEPENDENT_AMBULATORY_CARE_PROVIDER_SITE_OTHER): Payer: 59 | Admitting: Physical Therapy

## 2020-11-07 DIAGNOSIS — M25562 Pain in left knee: Secondary | ICD-10-CM

## 2020-11-07 DIAGNOSIS — M25662 Stiffness of left knee, not elsewhere classified: Secondary | ICD-10-CM | POA: Diagnosis not present

## 2020-11-07 DIAGNOSIS — R600 Localized edema: Secondary | ICD-10-CM

## 2020-11-07 DIAGNOSIS — R262 Difficulty in walking, not elsewhere classified: Secondary | ICD-10-CM
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
## 2020-11-12 ENCOUNTER — Other Ambulatory Visit: Payer: Self-pay | Admitting: Medical

## 2020-11-14 ENCOUNTER — Ambulatory Visit (INDEPENDENT_AMBULATORY_CARE_PROVIDER_SITE_OTHER): Payer: 59 | Admitting: Physical Therapy

## 2020-11-14 ENCOUNTER — Encounter: Payer: Self-pay | Admitting: Physical Therapy

## 2020-11-14 ENCOUNTER — Other Ambulatory Visit: Payer: Self-pay

## 2020-11-14 DIAGNOSIS — R600 Localized edema: Secondary | ICD-10-CM

## 2020-11-14 DIAGNOSIS — M25562 Pain in left knee: Secondary | ICD-10-CM

## 2020-11-14 DIAGNOSIS — M25662 Stiffness of left knee, not elsewhere classified: Secondary | ICD-10-CM

## 2020-11-14 DIAGNOSIS — R262 Difficulty in walking, not elsewhere classified: Secondary | ICD-10-CM

## 2020-11-14 NOTE — Therapy (Addendum)
[facility] Physical Therapy [address] [place], [place], [address] Phone: [phone]   Fax:  [phone]  Physical Therapy Treatment /Discharge   Patient Details  Name: Nicole Deleon MRN: [id number] Date of Birth: 13-Dec-1980 Referring Provider (PT): Marcene Duos MD Progress Note Reporting Period 10/03/2020 to 11/14/2020   See note below for Objective Data and Assessment of Progress/Goals.      Encounter Date: 11/14/2020   PT End of Session - 11/14/20 1023     Visit Number 9    Number of Visits 16    Date for PT Re-Evaluation 11/24/20    Authorization Type PN/recert sent on 09/22/411 for 6 more weeks 1x/week    Progress Note Due on Visit 16    PT Start Time 1015    PT Stop Time 1100    PT Time Calculation (min) 45 min    Activity Tolerance Patient limited by fatigue;Patient limited by pain    Behavior During Therapy [facility] for tasks assessed/performed             Past Medical History:  Diagnosis Date   Allergy    Anxiety    Arthritis    left knee   Chronic back pain    Depression    Headache    Hypertension    Pre-diabetes    diet controlled   SVD (spontaneous vaginal delivery)    x 3    Past Surgical History:  Procedure Laterality Date   no previous surgeries     PATELLA-FEMORAL ARTHROPLASTY Left 06/29/2020   Procedure: LEFT PATELLA-FEMORAL ARTHROPLASTY;  Surgeon: Meredith Pel, MD;  Location: [place];  Service: Orthopedics;  Laterality: Left;   TUBAL LIGATION      There were no vitals filed for this visit.   Subjective Assessment - 11/14/20 1050     Subjective Pt arriving today reporting 7/10 pain in her left knee. Pt reproting taking over the counter pain meds prior to therapy and before and after work. Pt stating ever since she started back work her swelling has worsened and her pain has gradually got worse. Pt is scheduled to see her orthopedist on Friday.    Pertinent History HTN, arthritis, back pain, depression     Limitations Walking    Diagnostic tests MRI: patellofemoral arthritis left knee    Patient Stated Goals Walk without pain, use my leg normally again    Currently in Pain? Yes    Pain Score 7     Pain Location Knee    Pain Orientation Left    Pain Descriptors / Indicators Aching;Sore;Sharp    Pain Type Surgical pain    Pain Onset More than a month ago                [facility] PT Assessment - 11/14/20 0001       Assessment   Medical Diagnosis M17.12 Left TKA    Referring Provider (PT) Marcene Duos MD    Onset Date/Surgical Date 06/29/20      Observation/Other Assessments   Focus on Therapeutic Outcomes (FOTO)  56% (predicted 61%)      AROM   Overall AROM Comments supine, I feel pt is limited due to increased swelling around her left knee    AROM Assessment Site Knee    Right/Left Knee Left    Left Knee Extension 0    Left Knee Flexion 115  [facility] Adult PT Treatment/Exercise - 11/14/20 0001       Exercises   Exercises Knee/Hip      Knee/Hip Exercises: Stretches   Quad Stretch Both;3 reps;20 seconds    Quad Stretch Limitations using strap    Press photographer Both;30 seconds;2 reps    Gastroc Stretch Limitations slant board      Knee/Hip Exercises: Aerobic   Recumbent Bike L3 x 8 minutes      Knee/Hip Exercises: Machines for Strengthening   Cybex Leg Press double leg press: 100# 2x15, Left LE only: 50# 2x15      Knee/Hip Exercises: Seated   Long Arc Quad Strengthening;Left;Weights;15 reps;1 set      Knee/Hip Exercises: Supine   Short Arc Quad Sets AROM;Left;15 reps    Short Arc Quad Sets Limitations holding 5 seconds, knee on ball    Heel Slides AROM;Left;15 reps    Straight Leg Raises Strengthening;Left;2 sets;15 reps      Vasopneumatic   Number Minutes Vasopneumatic  10 minutes    Vasopnuematic Location  Knee    Vasopneumatic Pressure Medium    Vasopneumatic Temperature  34                       PT  Short Term Goals - 11/07/20 0941       PT SHORT TERM GOAL #1   Title Pt will be independent in her initial HEP.    Status Achieved      PT SHORT TERM GOAL #2   Title Pt will improve her 5 time sit to stand to </= 14 seconds.    Status Achieved               PT Long Term Goals - 11/14/20 1045       PT LONG TERM GOAL #1   Title Pt will be independent in her advanced HEP.    Status On-going      PT LONG TERM GOAL #2   Title Pt will be able to amb without assistive device for community distances and surfaces with pain </= 2/10 in her left knee.    Status Achieved      PT LONG TERM GOAL #3   Title Pt will improve her left knee active ROM to >/= 0-120 degrees.    Status Achieved      PT LONG TERM GOAL #4   Title Pt will be able to increase her left knee strength to >/= 4+/5 in order to improve gait and functional mobility.    Status On-going      PT LONG TERM GOAL #5   Title Pt will improve her FOTO from 43% to >/= 61%.    Baseline 56% on 11/14/2020    Status On-going                   Plan - 11/14/20 1039     Clinical Impression Statement Pt arriving today reporting increased swelling in her left knee with > 3 cenimters difference between left and right knee. Pt also with edema noted on medial lower leg just below knee joint. Over the last several visits pt reporting working more and pt with noted decreased in left knee flexion ROM. Pt stating she has an appointment with her orthopedist on Friday 11/17/2020. Today's treatment focused on gentle strengtheing and ROM. Vasopneumatic applied at end of session to help with pain and swelling. Pt noted to cotninue to elevate and ice at home as well as  take ibrophen for pain and swelling on days she works. Continue skilled PT to progress toward LTG's.    Personal Factors and Comorbidities Comorbidity 3+    Comorbidities anxiety, arthritis, headaches, allergies, pre-diabetic    Examination-Activity Limitations  Lift;Squat;Stairs;Stand;Transfers;Other    Examination-Participation Restrictions Other;Occupation;Community Activity    Stability/Clinical Decision Making Stable/Uncomplicated    Rehab Potential Good    PT Frequency 1x / week    PT Duration 6 weeks    PT Treatment/Interventions Electrical Stimulation;ADLs/Self Care Home Management;Gait training;Stair training;Functional mobility training;Therapeutic activities;Therapeutic exercise;Balance training;Neuromuscular re-education;Patient/family education;Passive range of motion;Manual techniques;Taping;Vasopneumatic Device    PT Next Visit Plan LE strengthening including standing, dynamic balance, stairs    PT Home Exercise Plan Access Code: [email or web address]    [email or web address] and Agree with Plan of Care Patient             Patient will benefit from skilled therapeutic intervention in order to improve the following deficits and impairments:  Pain, Decreased strength, Decreased mobility, Increased edema, Decreased balance, Impaired flexibility, Decreased activity tolerance, Difficulty walking, Decreased range of motion  Visit Diagnosis: Acute pain of left knee  Stiffness of left knee, not elsewhere classified  Localized edema  Difficulty in walking, not elsewhere classified     Problem List Patient Active Problem List   Diagnosis Date Noted   Family history of premature CAD 08/14/2020   Status post left partial knee replacement 06/29/2020   Prediabetes 06/23/2020   Patellofemoral arthritis of left knee 04/04/2020   Chest pain 04/04/2020   Depressed mood 04/04/2020   Essential hypertension, benign 04/04/2020   Lipoma of lower extremity 04/04/2020   Tobacco use 04/04/2020    Oretha Caprice, PT, MPT 11/14/2020, 10:52 AM  PHYSICAL THERAPY DISCHARGE SUMMARY  Visits from Start of Care: 9  Current functional level related to goals / functional outcomes: See note   Remaining deficits: See note   Education / Equipment: HEP   Patient  agrees to discharge. Patient goals were partially met. Patient is being discharged due to not returning since the last visit. Scot Jun, PT, DPT, OCS, ATC 02/06/21  9:21 AM     [facility] Physical Therapy [address] [place], [place], [address] Phone: [phone]   Fax:  [phone]  Name: Nicole Deleon MRN: [id number] Date of Birth: [birth year]

## 2020-11-17 ENCOUNTER — Ambulatory Visit (INDEPENDENT_AMBULATORY_CARE_PROVIDER_SITE_OTHER): Payer: Self-pay

## 2020-11-17 ENCOUNTER — Encounter: Payer: Self-pay | Admitting: Surgical

## 2020-11-17 ENCOUNTER — Other Ambulatory Visit: Payer: Self-pay

## 2020-11-17 ENCOUNTER — Ambulatory Visit (INDEPENDENT_AMBULATORY_CARE_PROVIDER_SITE_OTHER): Payer: 59 | Admitting: Surgical

## 2020-11-17 DIAGNOSIS — M545 Low back pain, unspecified: Secondary | ICD-10-CM

## 2020-11-17 DIAGNOSIS — M1712 Unilateral primary osteoarthritis, left knee: Secondary | ICD-10-CM

## 2020-11-17 NOTE — Progress Notes (Signed)
Office Visit Note   Patient: Nicole Deleon           Date of Birth: April 27, 1980           MRN: [id number] Visit Date: 11/17/2020 Requested by: Carlena Hurl, PA-C [id number] [place],  [place] [address] PCP: Carlena Hurl, PA-C  Subjective: Chief Complaint  Patient presents with   Left Knee - Follow-up    HPI: Nicole Deleon is a 40 y.o. female who presents to the office complaining of left leg pain.  Patient is about 4 to 5 months out from left knee patellofemoral replacement.  She is still in physical therapy and has been continuing with her exercise program.  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  She states that she feels that her pain is getting worse over the last several weeks.  She began to notice low back pain which is not typical for her about 3 weeks ago with increasingly frequent radiation of pain from her back down her left leg.  This radicular pain involves the entirety of her left leg at times.  She feels the leg is weak subjectively and has given out on her twice in the last 3 weeks whereas she has not had any knee instability at her last appointment about 3 months out from surgery.  She denies any fevers or chills, night sweats, change in the appearance of the incision, drainage from the incision.  She is not really had any falls or injuries.  She is taking ibuprofen and gabapentin for her symptoms..  She denies any bowel or bladder incontinence or saddle anesthesia.              ROS: All systems reviewed are negative as they relate to the chief complaint within the history of present illness.  Patient denies fevers or chills.  Assessment & Plan: Visit Diagnoses:  1. Low back pain, unspecified back pain laterality, unspecified chronicity, unspecified whether sciatica present   2. Patellofemoral arthritis of left knee     Plan: Patient is a 40 year old female who presents for evaluation of  left leg pain.  She is about 4 to 5 months out from left patellofemoral arthroplasty.  She was doing well and progressively improving slowly but surely over the first 3 months to the point where she was ready to return to work at 36-month mark.  Now she states that she is heading in the wrong direction.  A lot of her pain seems to be related to the new onset of low back pain with left leg radicular pain in the last 3 weeks.  She also has subjective and objective weakness of the left leg on exam and by her history with the left leg giving out on her.  Radiographs of the lumbar spine are negative for any identifiable pathology.  Plan to order MRI of the lumbar spine for further evaluation of left leg radicular pain.  Plan to also obtain ESR, CRP, CBC with differential for evaluation of low-grade prosthetic joint infection of the left knee though suspicion for this is low.  Follow-Up Instructions: No follow-ups on file.   Orders:  Orders Placed This Encounter  Procedures   XR Lumbar Spine 2-3 Views   Sed Rate (ESR)   C-reactive protein   CBC with Differential   No orders of the defined types were placed in this encounter.     Procedures: No  procedures performed   Clinical Data: No additional findings.  Objective: Vital Signs: There were no vitals taken for this visit.  Physical Exam:  Constitutional: Patient appears well-developed HEENT:  Head: Normocephalic Eyes:EOM are normal Neck: Normal range of motion Cardiovascular: Normal rate Pulmonary/chest: Effort normal Neurologic: Patient is alert Skin: Skin is warm Psychiatric: Patient has normal mood and affect  Ortho Exam: Ortho exam demonstrates left knee with 0 degrees extension and 115 degrees of knee flexion.  Able to perform straight leg raise 10 to 15 degree extensor lag compared with contralateral leg which has 0 extensor lag.  No significantly increased warmth of the left knee.  No effusion noted.  Patient does have diffuse  tenderness throughout the left knee.  Positive straight leg raise on the left, negative on the right.  Straight leg raise on left yields pain that travels from her low back into the buttocks and down into the posterior thigh and posterior knee.  No pain with hip range of motion.  Incisions well-healed from prior surgery with no evidence of infection.  No sinus tract noted.  2+ patellar tendon reflexes bilaterally.  5 -/5 hip flexion strength on left compared with 5/5 on right.  4/5 quadricep strength on left compared with 5/5 on right.  Left/5 hamstring strength bilaterally.  5/5 dorsiflexion plantarflexion strength bilaterally.  5/5 EHL.  Moderate to severe tenderness throughout the axial lumbar spine around the level L4-L5.  Specialty Comments:  No specialty comments available.  Imaging: No results found.   PMFS History: Patient Active Problem List   Diagnosis Date Noted   Family history of premature CAD 08/14/2020   Status post left partial knee replacement 06/29/2020   Prediabetes 06/23/2020   Patellofemoral arthritis of left knee 04/04/2020   Chest pain 04/04/2020   Depressed mood 04/04/2020   Essential hypertension, benign 04/04/2020   Lipoma of lower extremity 04/04/2020   Tobacco use 04/04/2020   Past Medical History:  Diagnosis Date   Allergy    Anxiety    Arthritis    left knee   Chronic back pain    Depression    Headache    Hypertension    Pre-diabetes    diet controlled   SVD (spontaneous vaginal delivery)    x 3    Family History  Problem Relation Age of Onset   Hypertension Mother    Hypertension Father    Hyperlipidemia Father     Past Surgical History:  Procedure Laterality Date   no previous surgeries     PATELLA-FEMORAL ARTHROPLASTY Left 06/29/2020   Procedure: LEFT PATELLA-FEMORAL ARTHROPLASTY;  Surgeon: Meredith Pel, MD;  Location: [facility];  Service: Orthopedics;  Laterality: Left;   TUBAL LIGATION     Social History   Occupational History    Not on file  Tobacco Use   Smoking status: Former    Packs/day: 0.50    Years: 12.00    Pack years: 6.00    Types: Cigarettes    Quit date: 02/08/2020    Years since quitting: 0.7   Smokeless tobacco: Never   Tobacco comments:    Pt states had last cigarette yesterday 06/27/20.  Vaping Use   Vaping Use: Former  Substance and Sexual Activity   Alcohol use: Yes    Alcohol/week: 3.0 standard drinks    Types: 3 Standard drinks or equivalent per week    Comment: occassional liquor   Drug use: Not Currently    Types: Marijuana    Comment: Last  use was on 06/23/20   Sexual activity: Not Currently    Birth control/protection: None    Comment: Tubal Ligation

## 2020-11-18 LAB — CBC WITH DIFFERENTIAL/PLATELET
Absolute Monocytes: 764 cells/uL (ref 200–950)
Basophils Absolute: 64 cells/uL (ref 0–200)
Basophils Relative: 0.7 %
Eosinophils Absolute: 304 cells/uL (ref 15–500)
Eosinophils Relative: 3.3 %
HCT: 39.5 % (ref 35.0–45.0)
Hemoglobin: 12.2 g/dL (ref 11.7–15.5)
Lymphs Abs: 3229 cells/uL (ref 850–3900)
MCH: 26.1 pg — ABNORMAL LOW (ref 27.0–33.0)
MCHC: 30.9 g/dL — ABNORMAL LOW (ref 32.0–36.0)
MCV: 84.4 fL (ref 80.0–100.0)
MPV: 9.2 fL (ref 7.5–12.5)
Monocytes Relative: 8.3 %
Neutro Abs: 4839 cells/uL (ref 1500–7800)
Neutrophils Relative %: 52.6 %
Platelets: 398 10*3/uL (ref 140–400)
RBC: 4.68 10*6/uL (ref 3.80–5.10)
RDW: 14.1 % (ref 11.0–15.0)
Total Lymphocyte: 35.1 %
WBC: 9.2 10*3/uL (ref 3.8–10.8)

## 2020-11-18 LAB — SEDIMENTATION RATE: Sed Rate: 28 mm/h — ABNORMAL HIGH (ref 0–20)

## 2020-11-18 LAB — C-REACTIVE PROTEIN: CRP: 2.1 mg/L (ref <8.0)

## 2020-11-20 ENCOUNTER — Telehealth: Payer: Self-pay | Admitting: Physical Therapy

## 2020-11-20 ENCOUNTER — Other Ambulatory Visit: Payer: Self-pay

## 2020-11-20 ENCOUNTER — Encounter: Payer: 59 | Admitting: Physical Therapy

## 2020-11-20 DIAGNOSIS — M545 Low back pain, unspecified: Secondary | ICD-10-CM

## 2020-11-20 NOTE — Telephone Encounter (Signed)
I called pt to follow up with why she had to cancel her appointments and she stated her employer's benefits recently changed and she wanted to verify her costs before coming to therapy this week. Pt stated that if she was unable to clarify by next Monday's appointment on 11/27/2020 she would call and cancel her appointment.   Kearney Hard, PT, MMPT 11/20/20 3:50 PM

## 2020-11-27 ENCOUNTER — Encounter: Payer: 59 | Admitting: Physical Therapy

## 2020-11-27 ENCOUNTER — Telehealth: Payer: Self-pay | Admitting: Physical Therapy

## 2020-11-27 NOTE — Telephone Encounter (Signed)
I called pt to follow up about her missed physical therapy visit this morning at 9:30 am. I reminded pt about her next upcoming appointment on 12/04/2020 at 9:30. Pt was instructed to call and cancel if she was unable to make her next visit.  Kearney Hard, PT, MPT 11/27/20 10:12 AM

## 2020-12-04 ENCOUNTER — Encounter: Payer: 59 | Admitting: Physical Therapy

## 2020-12-04 ENCOUNTER — Telehealth: Payer: Self-pay | Admitting: Physical Therapy

## 2020-12-04 NOTE — Telephone Encounter (Signed)
I called pt about her missed physical therapy visit today at 9:30 am. I left a message about no further visits were scheduled. Pt instructed to call back if she wanted to continue therapy where her insurance would pay for additional visits.  Our call back number is [phone].   Kearney Hard, PT,MPT 12/04/20 10:50 AM

## 2021-01-11 ENCOUNTER — Encounter: Payer: Self-pay | Admitting: *Deleted

## 2021-01-19 ENCOUNTER — Other Ambulatory Visit: Payer: Self-pay

## 2021-01-19 ENCOUNTER — Ambulatory Visit (HOSPITAL_COMMUNITY)
Admission: EM | Admit: 2021-01-19 | Discharge: 2021-01-19 | Disposition: A | Discharge status: Home / Self Care | Payer: Medicaid Other | Attending: Physician Assistant | Admitting: Physician Assistant

## 2021-01-19 ENCOUNTER — Encounter (HOSPITAL_COMMUNITY): Payer: Self-pay | Admitting: *Deleted

## 2021-01-19 DIAGNOSIS — Z973 Presence of spectacles and contact lenses: Secondary | ICD-10-CM

## 2021-01-19 DIAGNOSIS — S0502XA Injury of conjunctiva and corneal abrasion without foreign body, left eye, initial encounter: Secondary | ICD-10-CM

## 2021-01-19 DIAGNOSIS — H1032 Unspecified acute conjunctivitis, left eye: Secondary | ICD-10-CM

## 2021-01-19 MED ORDER — OFLOXACIN 0.3 % OP SOLN: 1.0000 [drp] | Freq: Four times a day (QID) | OPHTHALMIC | 0 refills | Status: DC

## 2021-01-19 MED ORDER — FLUORESCEIN SODIUM 1 MG OP STRP
ORAL_STRIP | OPHTHALMIC | Status: AC
  Filled 2021-01-19: qty 1

## 2021-01-19 MED ORDER — TETRACAINE HCL 0.5 % OP SOLN
OPHTHALMIC | Status: AC
  Filled 2021-01-19: qty 4

## 2021-01-19 NOTE — ED Provider Notes (Signed)
[facility]    CSN: [id number] Arrival date & time: 01/19/21  1429      History   Chief Complaint Chief Complaint  Patient presents with   Eye Problem    HPI Nicole Deleon is a 41 y.o. female.   Patient presents today with a several day history of left eye irritation.  Reports that she was doing an at-home project where she was removing popcorn ceiling and believes something might of gotten into her eye.  She has had a scratching feeling as well as increased discharge from the eye.  Overnight this is significantly worsened and she woke up with her eye matted shut.  She does wear contacts but has not used them for approximately 1 month.  She denies any recent antibiotic use.  She has tried topical antihistamine drops with worsening of symptoms.  She denies any foreign body sensation or significant ocular pain but just reports some irritation particularly on the nasal side of her left eye.  She denies any recent illness or additional symptoms including cough, congestion, fever, nausea, vomiting.   Past Medical History:  Diagnosis Date   Allergy    Anxiety    Arthritis    left knee   Chronic back pain    Depression    Headache    Hypertension    Pre-diabetes    diet controlled   SVD (spontaneous vaginal delivery)    x 3    Patient Active Problem List   Diagnosis Date Noted   Family history of premature CAD 08/14/2020   Status post left partial knee replacement 06/29/2020   Prediabetes 06/23/2020   Patellofemoral arthritis of left knee 04/04/2020   Chest pain 04/04/2020   Depressed mood 04/04/2020   Essential hypertension, benign 04/04/2020   Lipoma of lower extremity 04/04/2020   Tobacco use 04/04/2020    Past Surgical History:  Procedure Laterality Date   no previous surgeries     PATELLA-FEMORAL ARTHROPLASTY Left 06/29/2020   Procedure: LEFT PATELLA-FEMORAL ARTHROPLASTY;  Surgeon: Meredith Pel, MD;  Location: [facility];  Service: Orthopedics;   Laterality: Left;   TUBAL LIGATION      OB History   No obstetric history on file.      Home Medications    Prior to Admission medications   Medication Sig Start Date End Date Taking? Authorizing Provider  ofloxacin (OCUFLOX) 0.3 % ophthalmic solution Place 1 drop into the left eye 4 (four) times daily. 01/19/21  Yes Gwendloyn Forsee, Derry Skill, PA-C  acetaminophen (TYLENOL) 500 MG tablet Take 500 mg by mouth 3 (three) times daily as needed for moderate pain.    [provider]  aspirin 81 MG chewable tablet Chew 1 tablet (81 mg total) by mouth daily. 06/30/20   Magnant, Charles L, PA-C  buPROPion (WELLBUTRIN XL) 300 MG 24 hr tablet TAKE 1 TABLET BY MOUTH EVERY DAY 11/13/20   Tysinger, Camelia Eng, PA-C  gabapentin (NEURONTIN) 100 MG capsule Take 1 capsule (100 mg total) by mouth 3 (three) times daily. 09/29/20   Magnant, Gerrianne Scale, PA-C  Multiple Vitamin (MULTIVITAMIN WITH MINERALS) TABS tablet Take 1 tablet by mouth daily.    [provider]  valsartan-hydrochlorothiazide (DIOVAN-HCT) 160-12.5 MG tablet Take 1 tablet by mouth daily. 08/14/20   Tysinger, Camelia Eng, PA-C    Family History Family History  Problem Relation Age of Onset   Hypertension Mother    Hypertension Father    Hyperlipidemia Father     Social History Social  History   Tobacco Use   Smoking status: Former    Packs/day: 0.50    Years: 12.00    Pack years: 6.00    Types: Cigarettes    Quit date: 02/08/2020    Years since quitting: 0.9   Smokeless tobacco: Never   Tobacco comments:    Pt states had last cigarette yesterday 06/27/20.  Vaping Use   Vaping Use: Former  Substance Use Topics   Alcohol use: Yes    Alcohol/week: 3.0 standard drinks    Types: 3 Standard drinks or equivalent per week    Comment: occassional liquor   Drug use: Not Currently    Types: Marijuana    Comment: Last use was on 06/23/20     Allergies   Patient has no known allergies.   Review of Systems Review of Systems   Constitutional:  Negative for activity change, appetite change, fatigue and fever.  Eyes:  Positive for discharge, redness and itching. Negative for photophobia, pain and visual disturbance.  Respiratory:  Negative for cough and shortness of breath.   Cardiovascular:  Negative for chest pain.  Gastrointestinal:  Negative for abdominal pain, diarrhea, nausea and vomiting.  Neurological:  Negative for dizziness, light-headedness and headaches.    Physical Exam Triage Vital Signs ED Triage Vitals  Enc Vitals Group     BP 01/19/21 1505 (!) 143/103     Pulse Rate 01/19/21 1505 82     Resp 01/19/21 1505 18     Temp 01/19/21 1505 98.5 F (36.9 C)     Temp src --      SpO2 01/19/21 1505 98 %     Weight --      Height --      Head Circumference --      Peak Flow --      Pain Score 01/19/21 1504 0     Pain Loc --      Pain Edu? --      Excl. in [place]? --    No data found.  Updated Vital Signs BP (!) 143/103    Pulse 82    Temp 98.5 F (36.9 C)    Resp 18    LMP 01/05/2021    SpO2 98%   Visual Acuity Right Eye Distance: 20/20 with correction Left Eye Distance: 20/30 with correction Bilateral Distance: 20/20 with correction  Right Eye Near:   Left Eye Near:    Bilateral Near:     Physical Exam Vitals reviewed.  Constitutional:      General: She is awake. She is not in acute distress.    Appearance: Normal appearance. She is well-developed. She is not ill-appearing.     Comments: Very pleasant female appears stated age in no acute distress sitting comfortably in exam room  HENT:     Head: Normocephalic and atraumatic.  Eyes:     General: Lids are everted, no foreign bodies appreciated.        Right eye: No hordeolum.        Left eye: No hordeolum.     Extraocular Movements: Extraocular movements intact.     Conjunctiva/sclera:     Right eye: Right conjunctiva is not injected. No hemorrhage.    Left eye: Left conjunctiva is injected. No hemorrhage.    Pupils: Pupils are  equal, round, and reactive to light.     Left eye: Corneal abrasion present. No fluorescein uptake.  Cardiovascular:     Rate and Rhythm: Normal rate and regular rhythm.  Heart sounds: Normal heart sounds, S1 normal and S2 normal. No murmur heard. Pulmonary:     Effort: Pulmonary effort is normal.     Breath sounds: Normal breath sounds. No wheezing, rhonchi or rales.     Comments: Clear auscultation bilaterally Psychiatric:        Behavior: Behavior is cooperative.     UC Treatments / Results  Labs (all labs ordered are listed, but only abnormal results are displayed) Labs Reviewed - No data to display  EKG   Radiology No results found.  Procedures Procedures (including critical care time)  Medications Ordered in UC Medications - No data to display  Initial Impression / Assessment and Plan / UC Course  I have reviewed the triage vital signs and the nursing notes.  Pertinent labs & imaging results that were available during my care of the patient were reviewed by me and considered in my medical decision making (see chart for details).     Fluorescein staining revealed corneal abrasion without obvious foreign body.  Given she is a contact lens wearer will start ofloxacin to cover for Pseudomonas.  Recommended she use lubricating eyedrops for additional symptom relief.  She can use warm compress to clean eye multiple times per day.  Discussed that if symptoms or not improving she should follow-up with ophthalmology was given contact information for local provider.  Discussed that she should not wear contact lenses until symptoms resolve and she is feeling much better.  Discussed that if she has any worsening symptoms including ocular pain, fever, increased drainage, visual change, headache she needs to be seen immediately.  She return precautions given to which she expressed understanding.  Final Clinical Impressions(s) / UC Diagnoses   Final diagnoses:  Abrasion of left  cornea, initial encounter  Acute bacterial conjunctivitis of left eye  Wears contact lenses     Discharge Instructions      Use eyedrops 4 times a day as prescribed.  Avoid touching tip of eye drop to your eye to prevent contamination.  Make sure to wash your hands prior to handling medication.  Use lubricating eyedrops/artificial tears as we discussed.  If symptoms not improving quickly please follow-up with ophthalmologist.  Do not wear contact lenses until symptoms have resolved.  If you have any worsening symptoms including ocular pain, vision changes, headache, dizziness you need to be seen immediately.     ED Prescriptions     Medication Sig Dispense Auth. Provider   ofloxacin (OCUFLOX) 0.3 % ophthalmic solution Place 1 drop into the left eye 4 (four) times daily. 5 mL Sire Poet K, PA-C      PDMP not reviewed this encounter.   Terrilee Croak, PA-C 01/19/21 1546

## 2021-01-19 NOTE — Discharge Instructions (Signed)
Use eyedrops 4 times a day as prescribed.  Avoid touching tip of eye drop to your eye to prevent contamination.  Make sure to wash your hands prior to handling medication.  Use lubricating eyedrops/artificial tears as we discussed.  If symptoms not improving quickly please follow-up with ophthalmologist.  Do not wear contact lenses until symptoms have resolved.  If you have any worsening symptoms including ocular pain, vision changes, headache, dizziness you need to be seen immediately.

## 2021-01-19 NOTE — ED Triage Notes (Signed)
Pt reports eye swelling started monday

## 2021-01-24 ENCOUNTER — Encounter: Payer: Self-pay | Admitting: Internal Medicine

## 2021-03-30 ENCOUNTER — Other Ambulatory Visit: Payer: Medicaid Other

## 2021-04-25 ENCOUNTER — Ambulatory Visit: Payer: Self-pay

## 2021-04-25 ENCOUNTER — Ambulatory Visit (INDEPENDENT_AMBULATORY_CARE_PROVIDER_SITE_OTHER): Payer: Managed Care, Other (non HMO)

## 2021-04-25 ENCOUNTER — Encounter: Payer: Self-pay | Admitting: Orthopedic Surgery

## 2021-04-25 ENCOUNTER — Ambulatory Visit: Payer: Managed Care, Other (non HMO) | Admitting: Orthopedic Surgery

## 2021-04-25 DIAGNOSIS — M25561 Pain in right knee: Secondary | ICD-10-CM | POA: Diagnosis not present

## 2021-04-25 DIAGNOSIS — G8929 Other chronic pain: Secondary | ICD-10-CM | POA: Diagnosis not present

## 2021-04-25 DIAGNOSIS — M25562 Pain in left knee: Secondary | ICD-10-CM

## 2021-04-25 MED ORDER — PREDNISONE 5 MG (21) PO TBPK: ORAL_TABLET | ORAL | 0 refills | Status: DC

## 2021-04-26 NOTE — Progress Notes (Signed)
? ?Office Visit Note ?  ?Patient: Nicole Deleon           ?Date of Birth: 1980/04/24           ?MRN: [id number] ?Visit Date: 04/25/2021 ?Requested by: Carlena Hurl, PA-C ?[address] ?[place],  [place] [address] ?PCP: Carlena Hurl, PA-C ? ?Subjective: ?Chief Complaint  ?Patient presents with  ? Lower Back - Follow-up  ? Right Knee - Pain  ? Left Knee - Dislocation  ? ? ?HPI: Nicole Deleon is a 41 year old patient with bilateral knee pain.  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  Is having some anterior knee pain on the left and right-hand side.  Also mild right-sided low back pain without any red flag symptoms.  Denies any swelling or fevers or chills in the knee region.  Overall the patellofemoral replacement has improved her functional abilities. ?             ?ROS: All systems reviewed are negative as they relate to the chief complaint within the history of present illness.  Patient denies  fevers or chills. ? ? ?Assessment & Plan: ?Visit Diagnoses:  ?1. Chronic pain of left knee   ?2. Chronic pain of right knee   ? ? ?Plan: Impression is bilateral knee pain with no structural abnormality seen on exam and normal radiographs.  Plan is Medrol Dosepak 6-day course and observation.  Continue with nonweightbearing quad strengthening exercises and follow-up as needed.  Could consider right knee injection if needed.  No effusion in either knee today. ? ?Follow-Up Instructions: Return if symptoms worsen or fail to improve.  ? ?Orders:  ?Orders Placed This Encounter  ?Procedures  ? XR Knee 1-2 Views Right  ? XR Knee 1-2 Views Left  ? ?Meds ordered this encounter  ?Medications  ? predniSONE (STERAPRED UNI-PAK 21 TAB) 5 MG (21) TBPK tablet  ?  Sig: Take dosepak as directed  ?  Dispense:  21 tablet  ?  Refill:  0  ? ? ? ? Procedures: ?No procedures performed ? ? ?Clinical Data: ?No additional findings. ? ?Objective: ?Vital Signs: There were no vitals taken for this visit. ? ?Physical  Exam:  ? ?Constitutional: Patient appears well-developed ?HEENT:  ?Head: Normocephalic ?Eyes:EOM are normal ?Neck: Normal range of motion ?Cardiovascular: Normal rate ?Pulmonary/chest: Effort normal ?Neurologic: Patient is alert ?Skin: Skin is warm ?Psychiatric: Patient has normal mood and affect ? ? ?Ortho Exam: Ortho exam demonstrates full active and passive range of motion of both knees with no effusion.  Both patellas track well with slightly more patellofemoral crepitus on the left compared to the right.  This is not unexpected.  No groin pain with internal/external Tatian of leg.  Pedal pulses palpable.  No other masses lymphadenopathy or skin changes noted in either knee region.  No nerve root tension signs.  No definite paresthesias L1 S1 bilaterally. ? ?Specialty Comments:  ?No specialty comments available. ? ?Imaging: ?XR Knee 1-2 Views Left ? ?Result Date: 04/26/2021 ?AP lateral radiographs left knee reviewed.  Patellofemoral replacement in good position alignment with no complicating features.  No significant joint space narrowing or acute fracture in the knee.  No evidence of hardware loosening ? ?XR Knee 1-2 Views Right ? ?Result Date: 04/26/2021 ?AP lateral radiographs right knee reviewed.  Mild patellofemoral arthritis is present.  Medial and lateral joint spaces are well-maintained with minimal degenerative changes and no spurring.  No acute fracture.  Overall  alignment intact.  ? ? ?PMFS History: ?Patient Active Problem List  ? Diagnosis Date Noted  ? Family history of premature CAD 08/14/2020  ? Status post left partial knee replacement 06/29/2020  ? Prediabetes 06/23/2020  ? Patellofemoral arthritis of left knee 04/04/2020  ? Chest pain 04/04/2020  ? Depressed mood 04/04/2020  ? Essential hypertension, benign 04/04/2020  ? Lipoma of lower extremity 04/04/2020  ? Tobacco use 04/04/2020  ? ?Past Medical History:  ?Diagnosis Date  ? Allergy   ? Anxiety   ? Arthritis   ? left knee  ? Chronic back pain    ? Depression   ? Headache   ? Hypertension   ? Pre-diabetes   ? diet controlled  ? SVD (spontaneous vaginal delivery)   ? x 3  ?  ?Family History  ?Problem Relation Age of Onset  ? Hypertension Mother   ? Hypertension Father   ? Hyperlipidemia Father   ?  ?Past Surgical History:  ?Procedure Laterality Date  ? no previous surgeries    ? PATELLA-FEMORAL ARTHROPLASTY Left 06/29/2020  ? Procedure: LEFT PATELLA-FEMORAL ARTHROPLASTY;  Surgeon: Meredith Pel, MD;  Location: [facility];  Service: Orthopedics;  Laterality: Left;  ? TUBAL LIGATION    ? ?Social History  ? ?Occupational History  ? Not on file  ?Tobacco Use  ? Smoking status: Former  ?  Packs/day: 0.50  ?  Years: 12.00  ?  Pack years: 6.00  ?  Types: Cigarettes  ?  Quit date: 02/08/2020  ?  Years since quitting: 1.2  ? Smokeless tobacco: Never  ? Tobacco comments:  ?  Pt states had last cigarette yesterday 06/27/20.  ?Vaping Use  ? Vaping Use: Former  ?Substance and Sexual Activity  ? Alcohol use: Yes  ?  Alcohol/week: 3.0 standard drinks  ?  Types: 3 Standard drinks or equivalent per week  ?  Comment: occassional liquor  ? Drug use: Not Currently  ?  Types: Marijuana  ?  Comment: Last use was on 06/23/20  ? Sexual activity: Not Currently  ?  Birth control/protection: None  ?  Comment: Tubal Ligation  ? ? ? ? ? ?

## 2021-04-27 ENCOUNTER — Ambulatory Visit (INDEPENDENT_AMBULATORY_CARE_PROVIDER_SITE_OTHER): Payer: Managed Care, Other (non HMO)

## 2021-04-27 ENCOUNTER — Ambulatory Visit
Admission: EM | Admit: 2021-04-27 | Discharge: 2021-04-27 | Disposition: A | Discharge status: Home / Self Care | Payer: Managed Care, Other (non HMO) | Attending: Emergency Medicine | Admitting: Emergency Medicine

## 2021-04-27 DIAGNOSIS — M542 Cervicalgia: Secondary | ICD-10-CM | POA: Diagnosis not present

## 2021-04-27 DIAGNOSIS — M546 Pain in thoracic spine: Secondary | ICD-10-CM

## 2021-04-27 DIAGNOSIS — M5134 Other intervertebral disc degeneration, thoracic region: Secondary | ICD-10-CM | POA: Diagnosis not present

## 2021-04-27 MED ORDER — BACLOFEN 10 MG PO TABS: 10.0000 mg | ORAL_TABLET | Freq: Three times a day (TID) | ORAL | 0 refills | Status: AC

## 2021-04-27 MED ORDER — METHYLPREDNISOLONE 4 MG PO TABS: ORAL_TABLET | ORAL | 0 refills | Status: AC

## 2021-04-27 MED ORDER — ACETAMINOPHEN 500 MG PO TABS: 1000.0000 mg | ORAL_TABLET | Freq: Three times a day (TID) | ORAL | 0 refills | Status: AC

## 2021-04-27 MED ORDER — DICLOFENAC SODIUM 1 % EX GEL: 4.0000 g | Freq: Four times a day (QID) | CUTANEOUS | 2 refills | Status: DC

## 2021-04-27 NOTE — ED Triage Notes (Signed)
Pt c/o upper back pain, that began 3 days ago (intermittent).  ?

## 2021-04-27 NOTE — Discharge Instructions (Signed)
The mainstay of therapy for musculoskeletal pain is reduction of inflammation and relaxation of tension which is causing inflammation.  Keep in mind, pain always begets more pain.  To help you stay ahead of your pain and inflammation, I have provided the following regimen for you: ?  ?Please begin taking Tylenol 1000 mg 3 times daily (every 8 hours) as soon as you pick up your prescriptions from the pharmacy. ?  ?This evening, you can begin taking baclofen 10 mg.  This is a highly effective muscle relaxer and antispasmodic which should continue to provide you with relaxation of your tense muscles, allow you to sleep well and to keep your pain under control.  You can continue taking this medication 3 times daily as you need to.  If you find that this medication makes you too sleepy, you can break them in half for your daytime doses and, if needed double them for your nighttime dose.  Do not take more than 30 mg of baclofen in a 24-hour period. ?  ?Tomorrow morning, please begin taking methylprednisolone.  Please take all tablets of the daily recommended dose with your breakfast meal.  If you have had significant relation of your pain before you finish the entire prescription, please feel free to discontinue.  It is not important to finish every dose. ?  ?During the day, please set aside time to apply ice to the affected area 4 times daily for 20 minutes each application.  This can be achieved by using a bag of frozen peas or corn, a Ziploc bag filled with ice and water, or Ziploc bag filled with half rubbing alcohol and half Dawn dish detergent, frozen into a slush.  Please be careful not to apply ice directly to your skin, always place a soft cloth between you and the ice pack. ?  ?You are welcome to use topical anti-inflammatory creams such as Voltaren gel, capsaicin or Aspercreme as recommended.  These medications are available over-the-counter, please follow manufactures instructions for use.  As a courtesy, I  provided you with a prescription for diclofenac in the event that your insurance will pay for this. ?  ?Please consider discussing referral to physical therapy with your primary care provider.  Physical therapist are very good at teasing out the underlying cause of acute lower back pain and helping with prevention of future recurrences. ?  ?Please avoid attempts to stretch or strengthen the affected area until you are feeling completely pain-free.  Attempts to do so will only prolong the healing process. ?  ?I also recommend that you remain out of work for the next several days, I provided you with a note to return to work in 3 days.  If you feel that you need this time extended, please follow-up with your primary care provider or return to urgent care for reevaluation so that we can provide you with a note for another 3 days. ?  ?Thank you for visiting urgent care today.  We appreciate the opportunity to participate in your care. ? ?

## 2021-04-27 NOTE — ED Provider Notes (Signed)
?[facility] ? ? ? ?CSN: [id number] ?Arrival date & time: 04/27/21  1506 ?  ? ?HISTORY  ? ?Chief Complaint  ?Patient presents with  ? Back Pain  ? ?HPI ?Nicole Deleon is a 41 y.o. female. Patient complains of upper back pain that began a few weeks ago but is gotten worse in the past few days.  Of note, patient was seen 2 days ago by orthopedics for bilateral chronic knee pain and is status post left PFA, patient has significant arthritic changes in her right knee as well.  Patient was provided with a Sterapred dosepak, was advised to continue NWB exercises to strengthen her quad and f/u PRN.  Patient states the steroids have helped a little bit. ? ?The history is provided by the patient.  ?Past Medical History:  ?Diagnosis Date  ? Allergy   ? Anxiety   ? Arthritis   ? left knee  ? Chronic back pain   ? Depression   ? Headache   ? Hypertension   ? Pre-diabetes   ? diet controlled  ? SVD (spontaneous vaginal delivery)   ? x 3  ? ?Patient Active Problem List  ? Diagnosis Date Noted  ? Family history of premature CAD 08/14/2020  ? Status post left partial knee replacement 06/29/2020  ? Prediabetes 06/23/2020  ? Patellofemoral arthritis of left knee 04/04/2020  ? Chest pain 04/04/2020  ? Depressed mood 04/04/2020  ? Essential hypertension, benign 04/04/2020  ? Lipoma of lower extremity 04/04/2020  ? Tobacco use 04/04/2020  ? ?Past Surgical History:  ?Procedure Laterality Date  ? no previous surgeries    ? PATELLA-FEMORAL ARTHROPLASTY Left 06/29/2020  ? Procedure: LEFT PATELLA-FEMORAL ARTHROPLASTY;  Surgeon: Meredith Pel, MD;  Location: [place];  Service: Orthopedics;  Laterality: Left;  ? TUBAL LIGATION    ? ?OB History   ?No obstetric history on file. ?  ? ?Home Medications   ? ?Prior to Admission medications   ?Medication Sig Start Date End Date Taking? Authorizing Provider  ?acetaminophen (TYLENOL) 500 MG tablet Take 500 mg by mouth 3 (three) times daily as needed for moderate pain.    [provider]  ?aspirin 81 MG chewable tablet Chew 1 tablet (81 mg total) by mouth daily. 06/30/20   Magnant, Charles L, PA-C  ?buPROPion (WELLBUTRIN XL) 300 MG 24 hr tablet TAKE 1 TABLET BY MOUTH EVERY DAY 11/13/20   Tysinger, Camelia Eng, PA-C  ?gabapentin (NEURONTIN) 100 MG capsule Take 1 capsule (100 mg total) by mouth 3 (three) times daily. 09/29/20   Magnant, Gerrianne Scale, PA-C  ?Multiple Vitamin (MULTIVITAMIN WITH MINERALS) TABS tablet Take 1 tablet by mouth daily.    [provider]  ?ofloxacin (OCUFLOX) 0.3 % ophthalmic solution Place 1 drop into the left eye 4 (four) times daily. 01/19/21   Raspet, Derry Skill, PA-C  ?predniSONE (STERAPRED UNI-PAK 21 TAB) 5 MG (21) TBPK tablet Take dosepak as directed 04/25/21   Meredith Pel, MD  ?valsartan-hydrochlorothiazide (DIOVAN-HCT) 160-12.5 MG tablet Take 1 tablet by mouth daily. 08/14/20   Tysinger, Camelia Eng, PA-C  ? ? ?Family History ?Family History  ?Problem Relation Age of Onset  ? Hypertension Mother   ? Hypertension Father   ? Hyperlipidemia Father   ? ?Social History ?Social History  ? ?Tobacco Use  ? Smoking status: Former  ?  Packs/day: 0.50  ?  Years: 12.00  ?  Pack years: 6.00  ?  Types: Cigarettes  ?  Quit date: 02/08/2020  ?  Years since quitting: 1.2  ? Smokeless tobacco: Never  ? Tobacco comments:  ?  Pt states had last cigarette yesterday 06/27/20.  ?Vaping Use  ? Vaping Use: Former  ?Substance Use Topics  ? Alcohol use: Yes  ?  Alcohol/week: 3.0 standard drinks  ?  Types: 3 Standard drinks or equivalent per week  ?  Comment: occassional liquor  ? Drug use: Not Currently  ?  Types: Marijuana  ?  Comment: Last use was on 06/23/20  ? ?Allergies   ?Patient has no known allergies. ? ?Review of Systems ?Review of Systems ?Pertinent findings noted in history of present illness.  ? ?Physical Exam ?Triage Vital Signs ?ED Triage Vitals  ?Enc Vitals Group  ?   BP 11/03/20 0827 (!) 147/82  ?   Pulse Rate 11/03/20 0827 72  ?   Resp 11/03/20 0827 18  ?   Temp 11/03/20  0827 98.3 ?F (36.8 ?C)  ?   Temp Source 11/03/20 0827 Oral  ?   SpO2 11/03/20 0827 98 %  ?   Weight --   ?   Height --   ?   Head Circumference --   ?   Peak Flow --   ?   Pain Score 11/03/20 0826 5  ?   Pain Loc --   ?   Pain Edu? --   ?   Excl. in [place]? --   ?No data found. ? ?Updated Vital Signs ?BP 129/88 (BP Location: Left Arm)   Pulse 79   Temp 98.1 ?F (36.7 ?C) (Oral)   Resp 18   LMP 04/02/2021 (Approximate)   SpO2 98%  ? ?Physical Exam ?Vitals and nursing note reviewed.  ?Constitutional:   ?   General: She is not in acute distress. ?   Appearance: Normal appearance. She is not ill-appearing.  ?HENT:  ?   Head: Normocephalic and atraumatic.  ?Eyes:  ?   General: Lids are normal.     ?   Right eye: No discharge.     ?   Left eye: No discharge.  ?   Extraocular Movements: Extraocular movements intact.  ?   Conjunctiva/sclera: Conjunctivae normal.  ?   Right eye: Right conjunctiva is not injected.  ?   Left eye: Left conjunctiva is not injected.  ?Neck:  ?   Trachea: Trachea and phonation normal.  ?Cardiovascular:  ?   Rate and Rhythm: Normal rate and regular rhythm.  ?   Pulses: Normal pulses.  ?   Heart sounds: Normal heart sounds. No murmur heard. ?  No friction rub. No gallop.  ?Pulmonary:  ?   Effort: Pulmonary effort is normal. No accessory muscle usage, prolonged expiration or respiratory distress.  ?   Breath sounds: Normal breath sounds. No stridor, decreased air movement or transmitted upper airway sounds. No decreased breath sounds, wheezing, rhonchi or rales.  ?Chest:  ?   Chest wall: No tenderness.  ?Musculoskeletal:     ?   General: Tenderness (Thoracic paraspinous muscles) present. Normal range of motion.  ?   Cervical back: Normal range of motion and neck supple. Normal range of motion.  ?Lymphadenopathy:  ?   Cervical: No cervical adenopathy.  ?Skin: ?   General: Skin is warm and dry.  ?   Findings: No erythema or rash.  ?Neurological:  ?   General: No focal deficit present.  ?   Mental  Status: She is alert and oriented to person, place, and time.  ?Psychiatric:     ?  Mood and Affect: Mood normal.     ?   Behavior: Behavior normal.  ? ? ?Visual Acuity ?Right Eye Distance:   ?Left Eye Distance:   ?Bilateral Distance:   ? ?Right Eye Near:   ?Left Eye Near:    ?Bilateral Near:    ? ?[facility] / Diagnostics / Procedures:  ?  ?EKG ? ?Radiology ?DG Cervical Spine 2-3 Views ? ?Result Date: 04/27/2021 ?CLINICAL DATA:  Upper back pain, neck pain beginning 3 days ago, intermittent EXAM: THORACIC SPINE 2 VIEWS; CERVICAL SPINE - 2-3 VIEW COMPARISON:  Cervical spine radiographs 05/22/2007 FINDINGS: Cervical: The cervical spine is imaged through the C7 vertebral body in the lateral projection. Vertebral body heights are preserved. Alignment is normal. There is no evidence of acute injury. The disc spaces are preserved. The neural foramina appear patent. The prevertebral soft tissues are unremarkable. Thoracic: Vertebral body heights are preserved. Alignment is normal. There is no evidence of acute injury. The disc spaces are preserved. There is no significant degenerative change. The imaged heart and lungs are unremarkable. IMPRESSION: Unremarkable cervical and thoracic spine radiographs. Electronically Signed   By: Valetta Mole M.D.   On: 04/27/2021 16:06  ? ?DG Thoracic Spine 2 View ? ?Result Date: 04/27/2021 ?CLINICAL DATA:  Upper back pain, neck pain beginning 3 days ago, intermittent EXAM: THORACIC SPINE 2 VIEWS; CERVICAL SPINE - 2-3 VIEW COMPARISON:  Cervical spine radiographs 05/22/2007 FINDINGS: Cervical: The cervical spine is imaged through the C7 vertebral body in the lateral projection. Vertebral body heights are preserved. Alignment is normal. There is no evidence of acute injury. The disc spaces are preserved. The neural foramina appear patent. The prevertebral soft tissues are unremarkable. Thoracic: Vertebral body heights are preserved. Alignment is normal. There is no evidence of acute injury. The  disc spaces are preserved. There is no significant degenerative change. The imaged heart and lungs are unremarkable. IMPRESSION: Unremarkable cervical and thoracic spine radiographs. Electronically Signed   By: Elisha Headland

## 2021-05-01 ENCOUNTER — Encounter: Payer: Self-pay | Admitting: Medical

## 2021-05-01 ENCOUNTER — Ambulatory Visit (INDEPENDENT_AMBULATORY_CARE_PROVIDER_SITE_OTHER): Payer: Managed Care, Other (non HMO) | Admitting: Medical

## 2021-05-01 VITALS — BP 120/80 | HR 75 | Temp 97.6°F | Wt 266.2 lb

## 2021-05-01 DIAGNOSIS — M549 Dorsalgia, unspecified: Secondary | ICD-10-CM

## 2021-05-01 DIAGNOSIS — M62838 Other muscle spasm: Secondary | ICD-10-CM | POA: Diagnosis not present

## 2021-05-01 DIAGNOSIS — M546 Pain in thoracic spine: Secondary | ICD-10-CM | POA: Diagnosis not present

## 2021-05-01 DIAGNOSIS — Z720 Tobacco use: Secondary | ICD-10-CM

## 2021-05-01 NOTE — Progress Notes (Signed)
Subjective: ? Nicole Deleon is a 41 y.o. female who presents for ?Chief Complaint  ?Patient presents with  ? back issues  ?  Went to UC on Back issues and was told DDD and was taken out of work and was told to follow-up. Not gotten much better since UC visit  ?   ?She is here for back pain.   Started having some back pain about a week ago.  Pain is whole back but mostly mid to upper back.   About a week ago, couldn't walk upright, had pain during job, couldn't function normally.  Went to urgent care 04/27/21 for same.  Had xrays.  Was given medication, advised to f/u with PCP.  She had lumber xray 11/2020 by Dr. Marlou Sa, ortho.    ? ?(I reviewed urgent care notes from 04/27/21.  Was advised Methylprednisolone steroid dose, tylenol, baclofen).  Using baclofen some but this makes her sleepy.  Almost done with steroid dose pack. Has seen some mild improvement of symptoms. ? ?She notes today in right upper back, shoulder blade.   No arm or leg pain, no numbness, no tinging, no weakness.   No recent fall or injury.     ? ?She denies long history of back pain.   She does have hx/o left knee replacement, and thinks maybe she is leaning more to right, favoring right leg since knee replacement.   Had knee replacement 06/2020.   ?Muscle  ?No other aggravating or relieving factors.   ? ?No other c/o. ? ?The following portions of the patient's history were reviewed and updated as appropriate: allergies, current medications, past family history, past medical history, past social history, past surgical history and problem list. ? ?ROS ?Otherwise as in subjective above ? ? ? ?Objective: ?BP 120/80   Pulse 75   Temp 97.6 ?F (36.4 ?C)   Wt 266 lb 3.2 oz (120.7 kg)   LMP 04/02/2021 (Approximate)   BMI 37.13 kg/m?  ? ?General appearance: alert, no distress, well developed, well nourished ?Neck tender on the right side but otherwise nontender no mass no lymphadenopathy, no thyromegaly, normal range of motion of the legs ?Back: Tender  throughout the right mid to upper back paraspinal, no obvious swelling or deformity, no rash, otherwise back nontender.  She does not appear to be in some pain with range of motion in general which is reduced somewhat ?Arms nontender with normal range of motion no obvious deformity or swelling ?Arms and legs seem to be neurovascularly intact ?No edema ? ? ?Assessment: ?Encounter Diagnoses  ?Name Primary?  ? Acute right-sided thoracic back pain Yes  ? Muscle spasm   ? Tobacco use   ? Back pain, unspecified back location, unspecified back pain laterality, unspecified chronicity   ? ? ? ?Plan: ?I reviewed her recent urgent care visit notes.  I reviewed cervical and thoracic spine x-rays from April 27, 2021, reviewed lumbar x-ray results from 11/17/2020.  Overall the x-rays are fairly unremarkable.  The thoracic spine x-ray shows slight joint space narrowing but otherwise mostly normal ? ?Symptoms suggest more of musculoskeletal origin.  No obvious arthritis or concern for ruptured disc.  I recommended stretching and exercise regularly.  She will finish out the steroid pack and baclofen given by urgent care a few days ago.  I advise she stop smoking as this does not help the situation. ? ?Referral to chiropractic therapy. ? ?Follow-up soon for fasting physical and labs ? ?Nicole Deleon was seen today for back issues. ? ?  Diagnoses and all orders for this visit: ? ?Acute right-sided thoracic back pain ?-     Ambulatory referral to Chiropractic ? ?Muscle spasm ?-     Ambulatory referral to Chiropractic ? ?Tobacco use ? ?Back pain, unspecified back location, unspecified back pain laterality, unspecified chronicity ?-     Ambulatory referral to Chiropractic ? ? ? ?Follow up: soon for fasting physical ?

## 2021-05-21 NOTE — Telephone Encounter (Signed)
err

## 2021-06-15 ENCOUNTER — Encounter: Payer: Managed Care, Other (non HMO) | Admitting: Medical

## 2021-06-15 ENCOUNTER — Telehealth: Payer: Self-pay | Admitting: Internal Medicine

## 2021-06-15 DIAGNOSIS — Z1231 Encounter for screening mammogram for malignant neoplasm of breast: Secondary | ICD-10-CM | POA: Insufficient documentation

## 2021-06-15 DIAGNOSIS — Z0000 Encounter for general adult medical examination without abnormal findings: Secondary | ICD-10-CM | POA: Insufficient documentation

## 2021-06-15 DIAGNOSIS — Z7185 Encounter for immunization safety counseling: Secondary | ICD-10-CM | POA: Insufficient documentation

## 2021-06-15 NOTE — Telephone Encounter (Signed)
This patient no showed for their appointment today.Which of the following is necessary for this patient.   A) No follow-up necessary   B) Follow-up urgent. Locate Patient Immediately.   C) Follow-up necessary. Contact patient and Schedule visit in ____ Days.   D) Follow-up Advised. Contact patient and Schedule visit in ____ Days.  E) Please Send no show letter to patient. Charge no show fee if no show was a CPE.    Pt has no showed twice for a cpe. Once last year around this time and again today. Please advise. She also no showed for a follow-up last year as well.

## 2021-06-15 NOTE — Progress Notes (Deleted)
Subjective:   HPI  Nicole Deleon is a 41 y.o. female who presents for No chief complaint on file.   Patient Care Team: Swannie Milius, Cleda Mccreedy as PCP - General (Family Medicine) Sees dentist Sees eye doctor  Concerns: ***  Gynecological history:  *** pregnancies, *** live births, ***. Periods are ***, periods *** heavy. ***patient sees gynecology for gynecology care.   Reviewed their medical, surgical, family, social, medication, and allergy history and updated chart as appropriate.   Review of Systems Constitutional: -fever, -chills, -sweats, -unexpected weight change, -decreased appetite, -fatigue Allergy: -sneezing, -itching, -congestion Dermatology: -changing moles, --rash, -lumps ENT: -runny nose, -ear pain, -sore throat, -hoarseness, -sinus pain, -teeth pain, - ringing in ears, -hearing loss, -nosebleeds Cardiology: -chest pain, -palpitations, -swelling, -difficulty breathing when lying flat, -waking up short of breath Respiratory: -cough, -shortness of breath, -difficulty breathing with exercise or exertion, -wheezing, -coughing up blood Gastroenterology: -abdominal pain, -nausea, -vomiting, -diarrhea, -constipation, -blood in stool, -changes in bowel movement, -difficulty swallowing or eating Hematology: -bleeding, -bruising  Musculoskeletal: -joint aches, -muscle aches, -joint swelling, -back pain, -neck pain, -cramping, -changes in gait Ophthalmology: denies vision changes, eye redness, itching, discharge Urology: -burning with urination, -difficulty urinating, -blood in urine, -urinary frequency, -urgency, -incontinence Neurology: -headache, -weakness, -tingling, -numbness, -memory loss, -falls, -dizziness Psychology: -depressed mood, -agitation, -sleep problems Breast/gyn: -breast tendnerss, -discharge, -lumps, -vaginal discharge,- irregular periods, -heavy periods      05/01/2021   10:15 AM 08/14/2020   11:07 AM 04/04/2020    1:08 PM  Depression screen PHQ  2/9  Decreased Interest 0 1 3  Down, Depressed, Hopeless 0 1 1  PHQ - 2 Score 0 2 4  Altered sleeping  1 1  Tired, decreased energy  1 3  Change in appetite  1 3  Feeling bad or failure about yourself   1 1  Trouble concentrating  0 0  Moving slowly or fidgety/restless  1 0  Suicidal thoughts  0 0  PHQ-9 Score  7 12  Difficult doing work/chores  Not difficult at all Somewhat difficult       Objective:  There were no vitals taken for this visit.  General appearance: alert, no distress, WD/WN, {Race/ethnicity:[id number]} female Skin: *** HEENT: normocephalic, conjunctiva/corneas normal, sclerae anicteric, PERRLA, EOMi, nares patent, no discharge or erythema, pharynx normal Oral cavity: MMM, tongue normal, teeth normal Neck: supple, no lymphadenopathy, no thyromegaly, no masses, normal ROM, no bruits Chest: non tender, normal shape and expansion Heart: RRR, normal S1, S2, no murmurs Lungs: CTA bilaterally, no wheezes, rhonchi, or rales Abdomen: +bs, soft, non tender, non distended, no masses, no hepatomegaly, no splenomegaly, no bruits Back: non tender, normal ROM, no scoliosis Musculoskeletal: upper extremities non tender, no obvious deformity, normal ROM throughout, lower extremities non tender, no obvious deformity, normal ROM throughout Extremities: no edema, no cyanosis, no clubbing Pulses: 2+ symmetric, upper and lower extremities, normal cap refill Neurological: alert, oriented x 3, CN2-12 intact, strength normal upper extremities and lower extremities, sensation normal throughout, DTRs 2+ throughout, no cerebellar signs, gait normal Psychiatric: normal affect, behavior normal, pleasant   Breast: nontender, no masses or lumps, no skin changes, no nipple discharge or inversion, no axillary lymphadenopathy Gyn: Normal external genitalia without lesions, vagina with normal mucosa, cervix without lesions, no cervical motion tenderness, no abnormal vaginal discharge.  Uterus and adnexa  not enlarged, nontender, no masses.  *** Pap performed.  Exam chaperoned by nurse. Rectal: ***  Breast/gyn/rectal - deferred to gynecology ***  Assessment and Plan :  No diagnosis found.   This visit was a preventative care visit, also known as wellness visit or routine physical.   Topics typically include healthy lifestyle, diet, exercise, preventative care, vaccinations, sick and well care, proper use of emergency dept and after hours care, as well as other concerns.     Recommendations: Continue to return yearly for your annual wellness and preventative care visits.  This gives [place] a chance to discuss healthy lifestyle, exercise, vaccinations, review your chart record, and perform screenings where appropriate.  I recommend you see your eye doctor yearly for routine vision care.  I recommend you see your dentist yearly for routine dental care including hygiene visits twice yearly.   Vaccination recommendations were reviewed Vax?? Td Pneumo   Screening for cancer: Colon cancer screening: *** We will refer you for screening colonoscopy We will refer you for Cologard  We will refer you for diagnostic colonoscopy due to *** I reviewed your colonoscopy on file that is up to date from *** You were given stool cards kit to return for hemoccult screening  Please call your insurance company to check coverage for colon cancer screening.  Options may include Cologard stool test or Colonoscopy.  You should also inquire about which facility the colonoscopy could be performed, and coverage for diagnostic vs screening colonoscopy as coverage may vary.  If you have significant family history of colon cancer or blood in the stool, then you should only do the colonoscopy, not the cologard test.   Breast cancer screening: You should perform a self breast exam monthly.   We reviewed recommendations for regular mammograms and breast cancer screening.  Cervical cancer screening: We reviewed  recommendations for pap smear screening.   Skin cancer screening: Check your skin regularly for new changes, growing lesions, or other lesions of concern Come in for evaluation if you have skin lesions of concern.  Lung cancer screening: If you have a greater than 20 pack year history of tobacco use, then you may qualify for lung cancer screening with a chest CT scan.   Please call your insurance company to inquire about coverage for this test.  We currently don't have screenings for other cancers besides breast, cervical, colon, and lung cancers.  If you have a strong family history of cancer or have other cancer screening concerns, please let me know.    Bone health: Get at least 150 minutes of aerobic exercise weekly Get weight bearing exercise at least once weekly Bone density test:  A bone density test is an imaging test that uses a type of X-ray to measure the amount of calcium and other minerals in your bones. The test may be used to diagnose or screen you for a condition that causes weak or thin bones (osteoporosis), predict your risk for a broken bone (fracture), or determine how well your osteoporosis treatment is working. The bone density test is recommended for females 21 and older, or females or males <95 if certain risk factors such as thyroid disease, long term use of steroids such as for asthma or rheumatological issues, vitamin D deficiency, estrogen deficiency, family history of osteoporosis, self or family history of fragility fracture in first degree relative.    Heart health: Get at least 150 minutes of aerobic exercise weekly Limit alcohol It is important to maintain a healthy blood pressure and healthy cholesterol numbers  Heart disease screening: Screening for heart disease includes screening for blood pressure, fasting lipids, glucose/diabetes screening, BMI  height to weight ratio, reviewed of smoking status, physical activity, and diet.    Goals include blood  pressure 120/80 or less, maintaining a healthy lipid/cholesterol profile, preventing diabetes or keeping diabetes numbers under good control, not smoking or using tobacco products, exercising most days per week or at least 150 minutes per week of exercise, and eating healthy variety of fruits and vegetables, healthy oils, and avoiding unhealthy food choices like fried food, fast food, high sugar and high cholesterol foods.    Other tests may possibly include EKG test, CT coronary calcium score, echocardiogram, exercise treadmill stress test.   ***Given exam findings or tobacco history, I recommend AAA screening.   Medical care options: I recommend you continue to seek care here first for routine care.  We try really hard to have available appointments Monday through Friday daytime hours for sick visits, acute visits, and physicals.  Urgent care should be used for after hours and weekends for significant issues that cannot wait till the next day.  The emergency department should be used for significant potentially life-threatening emergencies.  The emergency department is expensive, can often have long wait times for less significant concerns, so try to utilize primary care, urgent care, or telemedicine when possible to avoid unnecessary trips to the emergency department.  Virtual visits and telemedicine have been introduced since the pandemic started in 2020, and can be convenient ways to receive medical care.  We offer virtual appointments as well to assist you in a variety of options to seek medical care.   Advanced Directives: I recommend you consider completing a [place] and Living Will.   These documents respect your wishes and help alleviate burdens on your loved ones if you were to become terminally ill or be in a position to need those documents enforced.    You can complete Advanced Directives yourself, have them notarized, then have copies made for our office, for you  and for anybody you feel should have them in safe keeping.  Or, you can have an attorney prepare these documents.   If you haven't updated your Last Will and Testament in a while, it may be worthwhile having an attorney prepare these documents together and save on some costs.       Separate significant issues discussed: ***   There are no diagnoses linked to this encounter.   *** Vax Cancer screens Tobacco Obesity Heart Sex PHQ Metrics and health maintenance ***   Follow-up pending labs, yearly for physical

## 2021-06-18 ENCOUNTER — Encounter: Payer: Self-pay | Admitting: Medical

## 2021-09-12 ENCOUNTER — Encounter: Payer: Self-pay | Admitting: Internal Medicine

## 2021-10-16 ENCOUNTER — Encounter: Payer: Self-pay | Admitting: Internal Medicine

## 2022-01-11 ENCOUNTER — Ambulatory Visit (INDEPENDENT_AMBULATORY_CARE_PROVIDER_SITE_OTHER): Payer: Medicaid Other

## 2022-01-11 ENCOUNTER — Ambulatory Visit (HOSPITAL_COMMUNITY)
Admission: EM | Admit: 2022-01-11 | Discharge: 2022-01-11 | Disposition: A | Discharge status: Home / Self Care | Payer: Medicaid Other | Attending: Family Medicine | Admitting: Family Medicine

## 2022-01-11 ENCOUNTER — Encounter (HOSPITAL_COMMUNITY): Payer: Self-pay | Admitting: Emergency Medicine

## 2022-01-11 DIAGNOSIS — M25562 Pain in left knee: Secondary | ICD-10-CM

## 2022-01-11 DIAGNOSIS — R0789 Other chest pain: Secondary | ICD-10-CM

## 2022-01-11 DIAGNOSIS — M25561 Pain in right knee: Secondary | ICD-10-CM

## 2022-01-11 MED ORDER — KETOROLAC TROMETHAMINE 30 MG/ML IJ SOLN
30.0000 mg | Freq: Once | INTRAMUSCULAR | Status: AC
  Administered 2022-01-11: 30 mg via INTRAMUSCULAR

## 2022-01-11 MED ORDER — IBUPROFEN 800 MG PO TABS: 800.0000 mg | ORAL_TABLET | Freq: Three times a day (TID) | ORAL | 0 refills | Status: AC | PRN

## 2022-01-11 MED ORDER — KETOROLAC TROMETHAMINE 30 MG/ML IJ SOLN
INTRAMUSCULAR | Status: AC
  Filled 2022-01-11: qty 1
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
## 2022-01-11 NOTE — Discharge Instructions (Signed)
The x-rays did not show any broken bones  You have been given a shot of Toradol 30 mg today.  Take ibuprofen 800 mg--1 tab every 8 hours as needed for pain.

## 2022-05-24 ENCOUNTER — Ambulatory Visit (HOSPITAL_COMMUNITY): Payer: Medicaid Other

## 2022-07-06 IMAGING — MR MR KNEE*L* W/O CM
5 of 7 series · 22 of 40 positions shown · non-contrast
Comparison: Plain films left knee 04/12/2020.

CLINICAL DATA: Anterior left knee pain for 5 years. No known
injury.

EXAM:
MRI OF THE LEFT KNEE WITHOUT CONTRAST
TECHNIQUE: Multiplanar, multisequence MR imaging of the knee was performed. No
intravenous contrast was administered.

[Series 3: T2 fat-sat · axial · 4.0mm · 0.62mm/px · z∈[-115,+14]mm · 5 of 27 slices shown (1 of 2)]
[im 1/27]
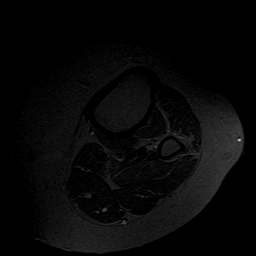
[im 7/27]
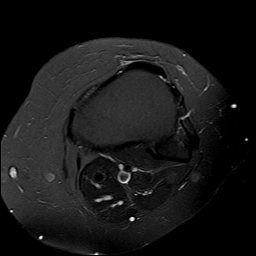
[im 14/27]
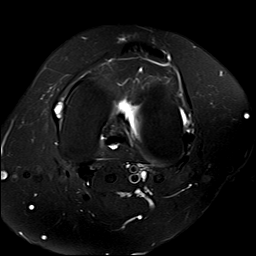
[im 20/27]
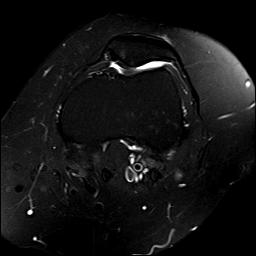
[im 27/27]
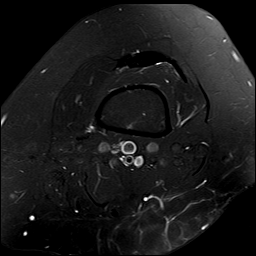

[Series 5: T2 fat-sat · coronal · 4.0mm · 0.29mm/px · 1 of 25 slices shown (2 of 2)]
[im 1/25]
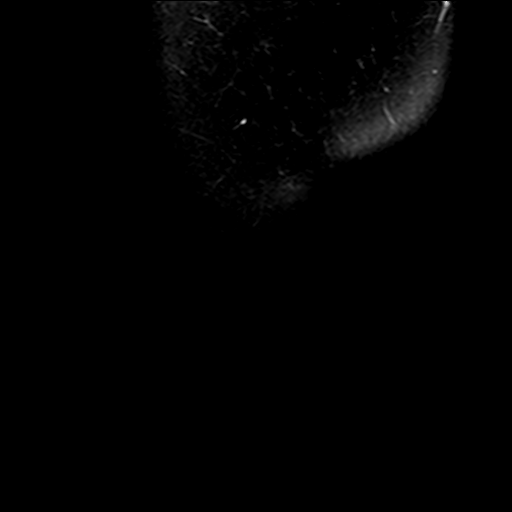

[Series 6: PD fat-sat · coronal · 3.0mm · 0.29mm/px · 7 of 30 slices shown (1 of 3)]
[im 1/30]
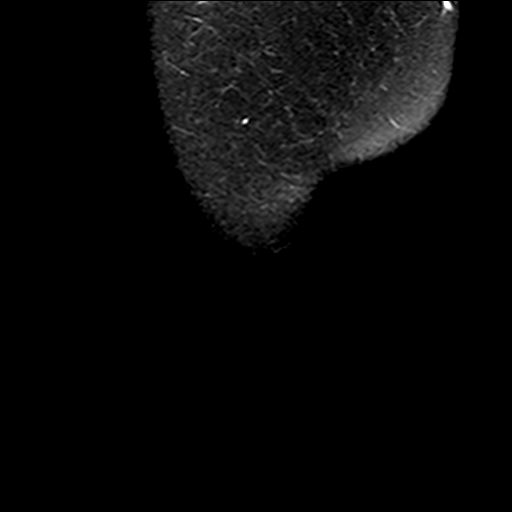
[im 5/30]
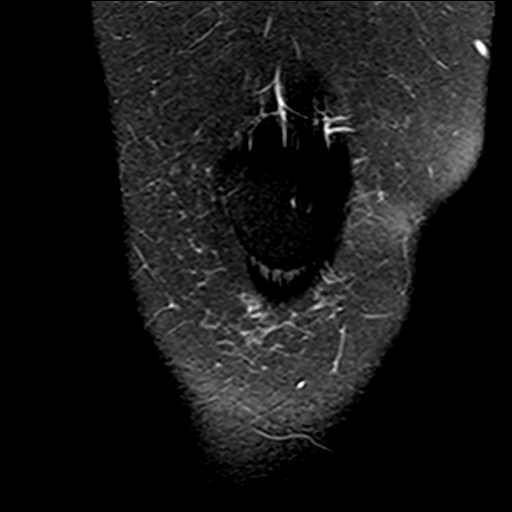
[im 10/30]
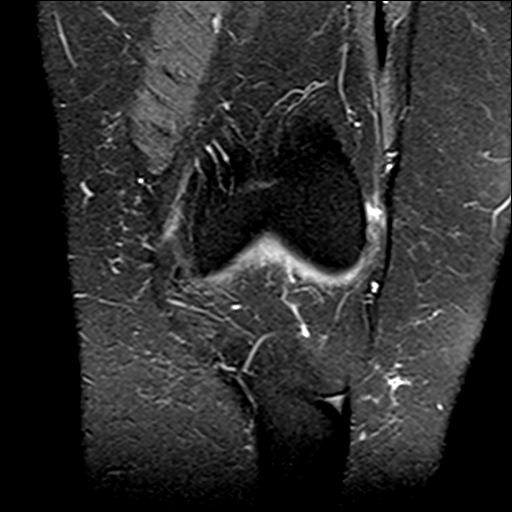
[im 15/30]
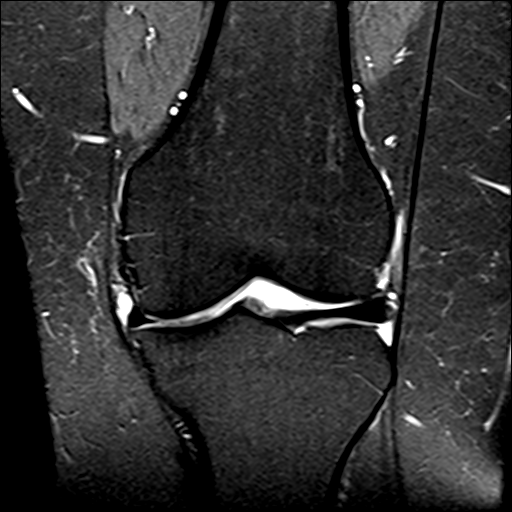
[im 20/30]
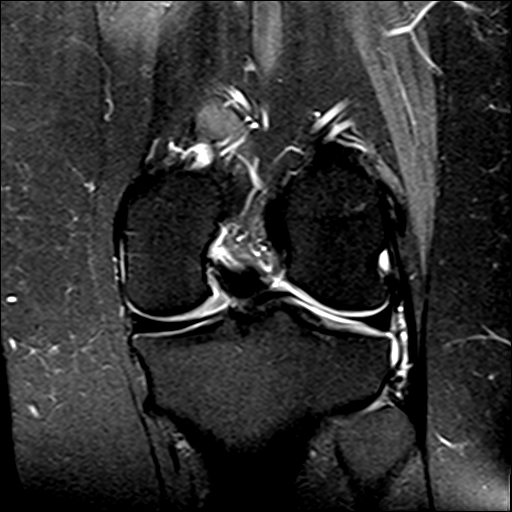
[im 25/30]
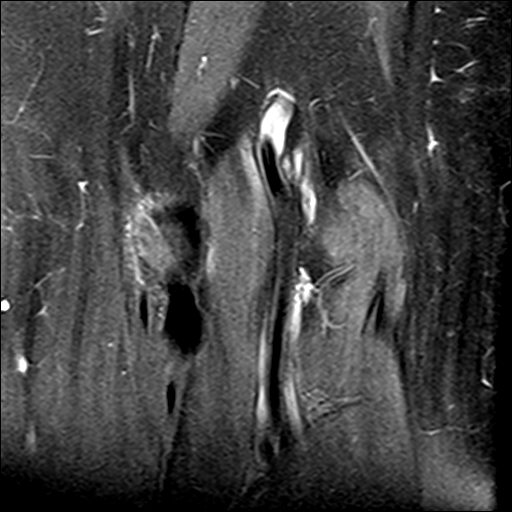
[im 30/30]
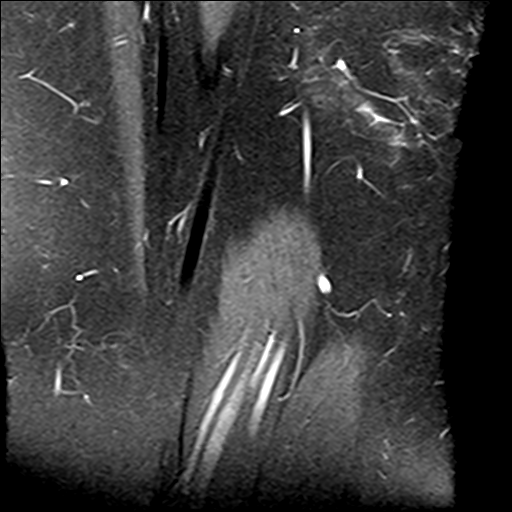

[Series 8: PD fat-sat · sagittal · 3.0mm · 0.29mm/px · 6 of 28 slices shown (2 of 3)]
[im 1/28]
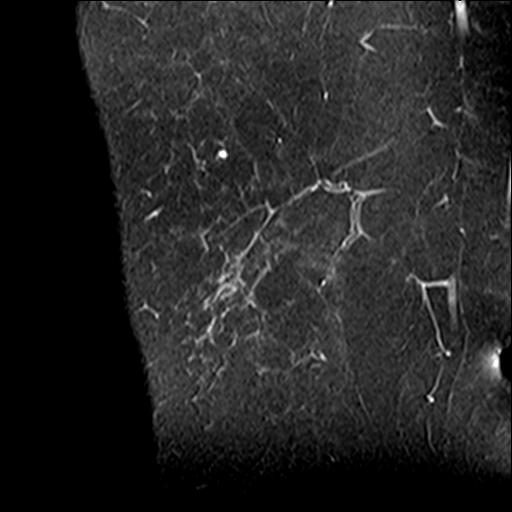
[im 6/28]
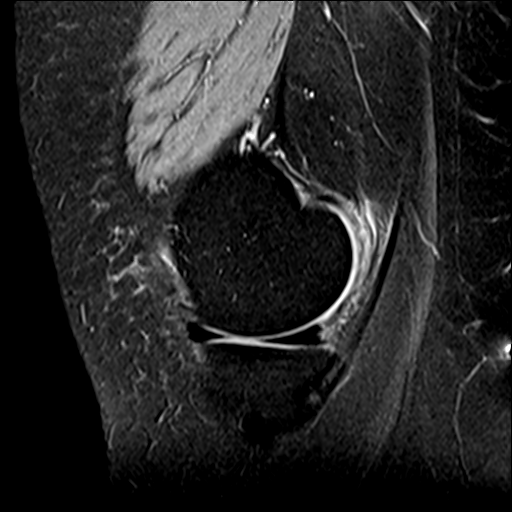
[im 11/28]
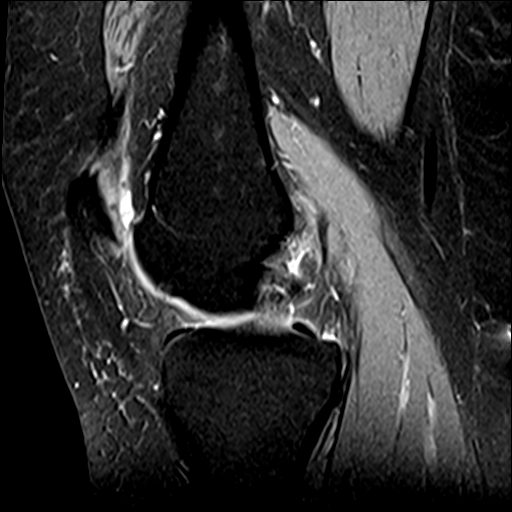
[im 17/28]
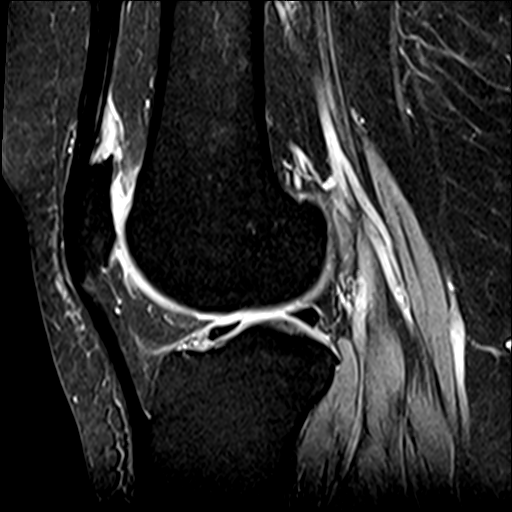
[im 22/28]
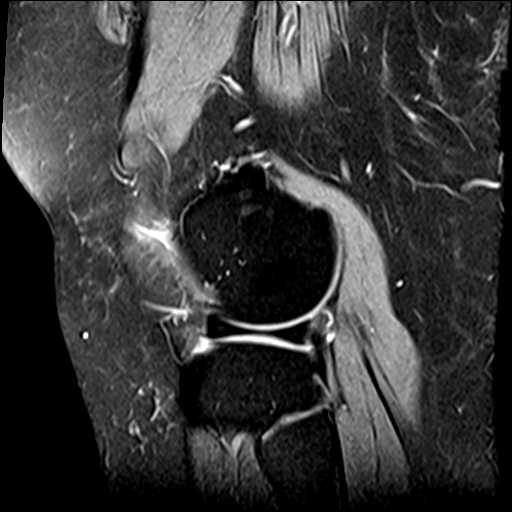
[im 28/28]
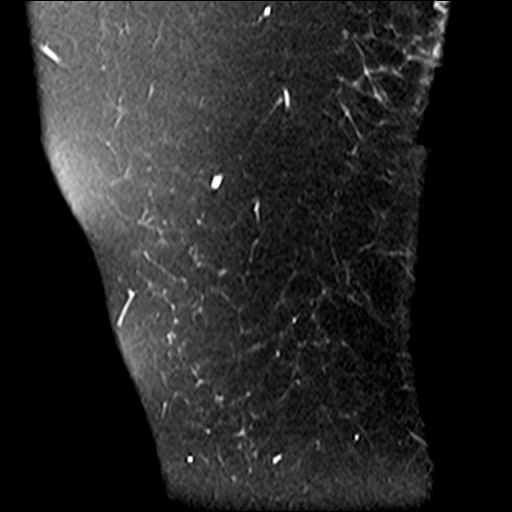

[Series 9: PD fat-sat · oblique · 2.0mm · 0.29mm/px · 3 of 14 slices shown (3 of 3)]
[im 1/14]
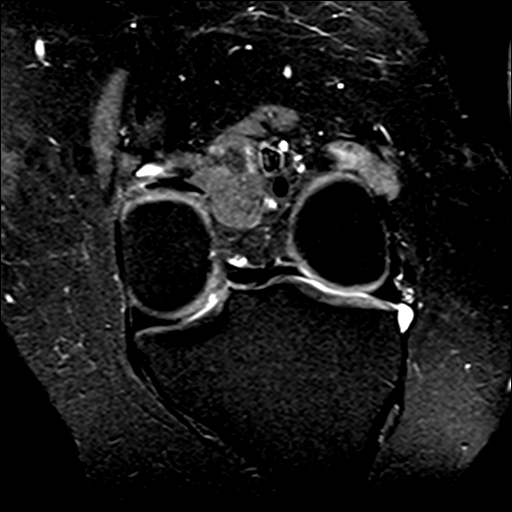
[im 7/14]
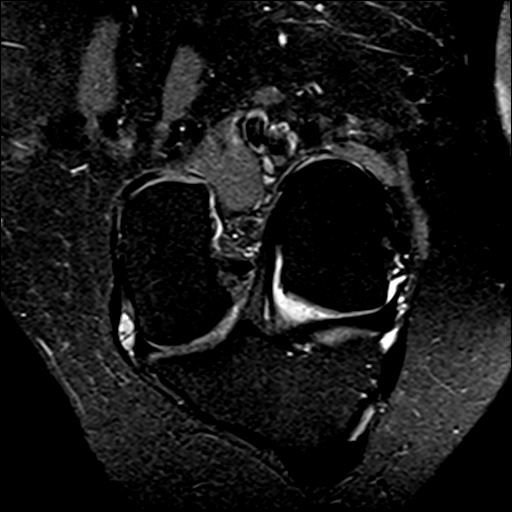
[im 14/14]
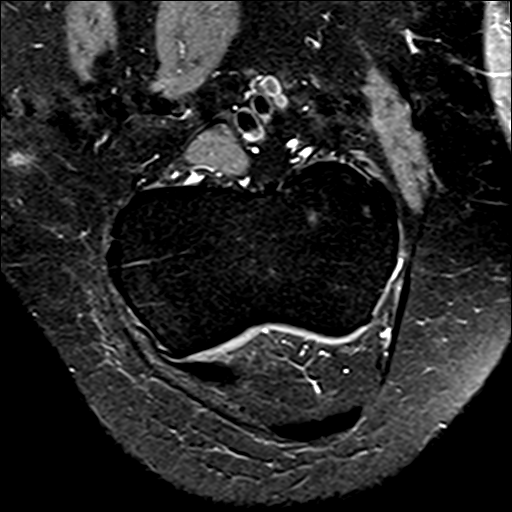

[22 of 40 positions shown; findings below may reference images not displayed]

FINDINGS: MENISCI

Medial meniscus: A septated cyst measuring 1.3 cm craniocaudal by
1.2 cm AP by 0.5 cm transverse is seen along the mid body of the MCL
but no meniscal tear is identified.

Lateral meniscus:  Intact.

LIGAMENTS

Cruciates:  Intact.

Collaterals:  Intact.

CARTILAGE

Patellofemoral: Severe cartilage loss is present along the lateral
patellar facet. There is a fissure in hyaline cartilage along the
medial patellar facet.

Medial:  Preserved.

Lateral:  Preserved.

Joint:  Small joint effusion.

Popliteal Fossa:  No Baker's cyst.

Extensor Mechanism:  Intact.

Bones: No fracture, stress change or worrisome lesion. Small foci of
subchondral edema are seen about the patella.

Other: None.
IMPRESSION: Dominant finding is advanced for age patellofemoral osteoarthritis.
The medial and lateral compartments are preserved.

Septated cyst along the body of the medial meniscus may be a
parameniscal cyst but no meniscal tear is identified. The cyst may
represent a ganglion.

## 2022-09-19 DIAGNOSIS — H5213 Myopia, bilateral: Secondary | ICD-10-CM | POA: Diagnosis not present

## 2022-11-05 ENCOUNTER — Ambulatory Visit: Payer: Medicaid Other | Admitting: Medical

## 2022-11-05 VITALS — BP 140/96 | HR 82 | Wt 258.2 lb

## 2022-11-05 DIAGNOSIS — R4589 Other symptoms and signs involving emotional state: Secondary | ICD-10-CM

## 2022-11-05 DIAGNOSIS — R519 Headache, unspecified: Secondary | ICD-10-CM | POA: Insufficient documentation

## 2022-11-05 DIAGNOSIS — F419 Anxiety disorder, unspecified: Secondary | ICD-10-CM | POA: Diagnosis not present

## 2022-11-05 DIAGNOSIS — I10 Essential (primary) hypertension: Secondary | ICD-10-CM

## 2022-11-05 DIAGNOSIS — R7303 Prediabetes: Secondary | ICD-10-CM | POA: Diagnosis not present

## 2022-11-05 DIAGNOSIS — Z720 Tobacco use: Secondary | ICD-10-CM

## 2022-11-05 MED ORDER — BUPROPION HCL ER (XL) 300 MG PO TB24: 300.0000 mg | ORAL_TABLET | Freq: Every day | ORAL | 1 refills | Status: DC

## 2022-11-05 MED ORDER — BUPROPION HCL ER (XL) 150 MG PO TB24: 150.0000 mg | ORAL_TABLET | Freq: Every day | ORAL | 0 refills | Status: DC

## 2022-11-05 MED ORDER — BISOPROLOL-HYDROCHLOROTHIAZIDE 5-6.25 MG PO TABS: 1.0000 | ORAL_TABLET | Freq: Every day | ORAL | 2 refills | Status: DC

## 2022-11-05 NOTE — Progress Notes (Signed)
Subjective:  Nicole Deleon is a 42 y.o. female who presents for Chief Complaint  Patient presents with   Medical Management of Chronic Issues    Med check. BP has been high in the 150/100s. Going through stress so having some depression/anxiety     Here for med check on blood pressure.  She notes her blood pressures have been running high.  She thinks stress has a lot to do with it.  She is under a lot of stress, feels depressed mood at a time, feels a lot of anxiety.  She has an initial appointment to see a counselor today.  She feels stressed all the time.  She is going through a separation from her ex partner which has been stressful  She lives with her mother, son and daughter  She works part-time in Aeronautical engineer, active on the job  She is compliant with hydrochlorothiazide.  She would like to go back on Wellbutrin which she was on prior to help with mood.  That seemed to help.  No SI, no HI  No other aggravating or relieving factors.    No other c/o.   Past Medical History:  Diagnosis Date   Allergy    Anxiety    Arthritis    left knee   Chronic back pain    Depression    Headache    Hypertension    Pre-diabetes    diet controlled   SVD (spontaneous vaginal delivery)    x 3    Current Outpatient Medications on File Prior to Visit  Medication Sig Dispense Refill   buPROPion (WELLBUTRIN XL) 300 MG 24 hr tablet Take 1 tablet by mouth daily.     hydrochlorothiazide (HYDRODIURIL) 25 MG tablet Take 1 tablet by mouth daily.     ibuprofen (ADVIL) 800 MG tablet Take 1 tablet (800 mg total) by mouth every 8 (eight) hours as needed (pain). 21 tablet 0   No current facility-administered medications on file prior to visit.    The following portions of the patient's history were reviewed and updated as appropriate: allergies, current medications, past family history, past medical history, past social history, past surgical history and problem list.  ROS Otherwise as in  subjective above    Objective: BP (!) 140/96   Pulse 82   Wt 258 lb 3.2 oz (117.1 kg)   BMI 36.01 kg/m   General appearance: alert, no distress, well developed, well nourished Neck: supple, no lymphadenopathy, no thyromegaly, no masses Heart: RRR, normal S1, S2, no murmurs Lungs: CTA bilaterally, no wheezes, rhonchi, or rales Pulses: 2+ radial pulses, 2+ pedal pulses, normal cap refill Ext: no edema    Assessment: Encounter Diagnoses  Name Primary?   Essential hypertension, benign Yes   Anxiety    Depressed mood    Tobacco use    Prediabetes    Nonintractable headache, unspecified chronicity pattern, unspecified headache type      Plan: Hypertension-stop plain HCTZ and changed to bisoprolol HCT as below.  Hopefully this will help not only with blood pressure but her headaches and anxiety  Anxiety, depressed mood-follow-up with counseling today as planned for new patient consult.  Begin back on Wellbutrin 150 mg XL daily for the next month then go up to 300 mg after that  She does continue with tobacco.  We can discuss at the next visit  Headaches-we discussed trying to avoid stress where possible.  Counseled on diet, stress reduction, sleep, exercise.  [name] was seen today for  medical management of chronic issues.  Diagnoses and all orders for this visit:  Essential hypertension, benign  Anxiety  Depressed mood  Tobacco use  Prediabetes  Nonintractable headache, unspecified chronicity pattern, unspecified headache type  Other orders -     bisoprolol-hydrochlorothiazide (ZIAC) 5-6.25 MG tablet; Take 1 tablet by mouth daily. -     buPROPion (WELLBUTRIN XL) 150 MG 24 hr tablet; Take 1 tablet (150 mg total) by mouth daily. -     buPROPion (WELLBUTRIN XL) 300 MG 24 hr tablet; Take 1 tablet (300 mg total) by mouth daily.   Follow up: 5-6 weeks as planned for fasting physical

## 2022-11-27 ENCOUNTER — Other Ambulatory Visit: Payer: Self-pay | Admitting: Medical

## 2022-11-27 NOTE — Telephone Encounter (Signed)
Pt has an appt next month

## 2022-12-14 ENCOUNTER — Other Ambulatory Visit: Payer: Self-pay | Admitting: Medical

## 2022-12-16 NOTE — Telephone Encounter (Signed)
After month she was too increase this. Pt has an appt on 12/7

## 2022-12-24 ENCOUNTER — Encounter: Payer: Self-pay | Admitting: Medical

## 2022-12-24 ENCOUNTER — Ambulatory Visit (INDEPENDENT_AMBULATORY_CARE_PROVIDER_SITE_OTHER): Payer: Medicaid Other | Admitting: Medical

## 2022-12-24 VITALS — BP 110/72 | HR 76 | Ht 71.0 in | Wt 264.4 lb

## 2022-12-24 DIAGNOSIS — I10 Essential (primary) hypertension: Secondary | ICD-10-CM

## 2022-12-24 DIAGNOSIS — R053 Chronic cough: Secondary | ICD-10-CM

## 2022-12-24 DIAGNOSIS — R7303 Prediabetes: Secondary | ICD-10-CM

## 2022-12-24 DIAGNOSIS — Z7185 Encounter for immunization safety counseling: Secondary | ICD-10-CM | POA: Diagnosis not present

## 2022-12-24 DIAGNOSIS — Z113 Encounter for screening for infections with a predominantly sexual mode of transmission: Secondary | ICD-10-CM

## 2022-12-24 DIAGNOSIS — K029 Dental caries, unspecified: Secondary | ICD-10-CM | POA: Diagnosis not present

## 2022-12-24 DIAGNOSIS — Z0000 Encounter for general adult medical examination without abnormal findings: Secondary | ICD-10-CM | POA: Diagnosis not present

## 2022-12-24 DIAGNOSIS — Z720 Tobacco use: Secondary | ICD-10-CM

## 2022-12-24 DIAGNOSIS — Z23 Encounter for immunization: Secondary | ICD-10-CM | POA: Diagnosis not present

## 2022-12-24 DIAGNOSIS — Z1231 Encounter for screening mammogram for malignant neoplasm of breast: Secondary | ICD-10-CM

## 2022-12-24 DIAGNOSIS — Z1329 Encounter for screening for other suspected endocrine disorder: Secondary | ICD-10-CM | POA: Diagnosis not present

## 2022-12-24 DIAGNOSIS — F419 Anxiety disorder, unspecified: Secondary | ICD-10-CM | POA: Diagnosis not present

## 2022-12-24 DIAGNOSIS — Z8249 Family history of ischemic heart disease and other diseases of the circulatory system: Secondary | ICD-10-CM

## 2022-12-24 LAB — POCT URINALYSIS DIP (PROADVANTAGE DEVICE)
Bilirubin, UA: NEGATIVE
Blood, UA: NEGATIVE
Glucose, UA: NEGATIVE mg/dL
Ketones, POC UA: NEGATIVE mg/dL
Leukocytes, UA: NEGATIVE
Nitrite, UA: NEGATIVE
Protein Ur, POC: NEGATIVE mg/dL
Specific Gravity, Urine: 1.015
Urobilinogen, Ur: NEGATIVE
pH, UA: 7 (ref 5.0–8.0)

## 2022-12-24 LAB — LIPID PANEL

## 2022-12-24 MED ORDER — ALBUTEROL SULFATE HFA 108 (90 BASE) MCG/ACT IN AERS: 2.0000 | INHALATION_SPRAY | Freq: Four times a day (QID) | RESPIRATORY_TRACT | 0 refills | Status: DC | PRN

## 2022-12-24 MED ORDER — TRELEGY ELLIPTA 100-62.5-25 MCG/ACT IN AEPB: 1.0000 | INHALATION_SPRAY | Freq: Every day | RESPIRATORY_TRACT | 3 refills | Status: DC

## 2022-12-24 MED ORDER — BUPROPION HCL ER (XL) 300 MG PO TB24: 300.0000 mg | ORAL_TABLET | Freq: Every day | ORAL | 1 refills | Status: DC

## 2022-12-24 MED ORDER — BISOPROLOL-HYDROCHLOROTHIAZIDE 5-6.25 MG PO TABS: 1.0000 | ORAL_TABLET | Freq: Every day | ORAL | 3 refills | Status: AC

## 2022-12-24 NOTE — Progress Notes (Signed)
Subjective:   HPI  Nicole Deleon is a 42 y.o. female who presents for Chief Complaint  Patient presents with   Annual Exam    Fasting cpe, no concerns, will find obgyn since her medicaid kicked in. Will get tdap today    Patient Care Team: Marnae Madani, Kermit Balo, PA-C as PCP - General (Family Medicine)   Concerns: Fasting today for labs  Declines flu and pneumonia vaccine  Last menstrual period the beginning of the month.  Her main concern today is coughing and sometimes difficulty breathing, curious about COPD.  She has been smoking cigarettes since age 29.  She continues to smoke.  She does get a little winded easier in the last year  Sometimes hard to breathe, sometimes awakes her self coughing  Hypertension-compliant with medication  History of anxiety-compliant Wellbutrin.  No issues  Reviewed their medical, surgical, family, social, medication, and allergy history and updated chart as appropriate.  No Known Allergies  Past Medical History:  Diagnosis Date   Allergy    Anxiety    Arthritis    left knee   Chronic back pain    Depression    Headache    Hypertension    Pre-diabetes    diet controlled   SVD (spontaneous vaginal delivery)    x 3    Current Outpatient Medications on File Prior to Visit  Medication Sig Dispense Refill   ibuprofen (ADVIL) 800 MG tablet Take 1 tablet (800 mg total) by mouth every 8 (eight) hours as needed (pain). 21 tablet 0   No current facility-administered medications on file prior to visit.      Current Outpatient Medications:    albuterol (VENTOLIN HFA) 108 (90 Base) MCG/ACT inhaler, Inhale 2 puffs into the lungs every 6 (six) hours as needed for wheezing or shortness of breath., Disp: 18 g, Rfl: 0   Fluticasone-Umeclidin-Vilant (TRELEGY ELLIPTA) 100-62.5-25 MCG/ACT AEPB, Inhale 1 puff into the lungs daily., Disp: 28 each, Rfl: 3   ibuprofen (ADVIL) 800 MG tablet, Take 1 tablet (800 mg total) by mouth every 8 (eight) hours  as needed (pain)., Disp: 21 tablet, Rfl: 0   bisoprolol-hydrochlorothiazide (ZIAC) 5-6.25 MG tablet, Take 1 tablet by mouth daily., Disp: 90 tablet, Rfl: 3   buPROPion (WELLBUTRIN XL) 300 MG 24 hr tablet, Take 1 tablet (300 mg total) by mouth daily., Disp: 90 tablet, Rfl: 1  Family History  Problem Relation Age of Onset   Hypertension Mother    Heart disease Father        died of MI   Hypertension Father    Hyperlipidemia Father    Multiple sclerosis Brother    Cancer Maternal Grandfather     Past Surgical History:  Procedure Laterality Date   no previous surgeries     PATELLA-FEMORAL ARTHROPLASTY Left 06/29/2020   Procedure: LEFT PATELLA-FEMORAL ARTHROPLASTY;  Surgeon: Cammy Copa, MD;  Location: [facility] OR;  Service: Orthopedics;  Laterality: Left;   TUBAL LIGATION      Review of Systems  Constitutional:  Negative for chills, fever, malaise/fatigue and weight loss.  HENT:  Negative for congestion, ear pain, hearing loss, sore throat and tinnitus.   Eyes:  Negative for blurred vision, pain and redness.  Respiratory:  Positive for cough and shortness of breath. Negative for hemoptysis.   Cardiovascular:  Negative for chest pain, palpitations, orthopnea, claudication and leg swelling.  Gastrointestinal:  Negative for abdominal pain, blood in stool, constipation, diarrhea, nausea and vomiting.  Genitourinary:  Negative for  dysuria, flank pain, frequency, hematuria and urgency.  Musculoskeletal:  Negative for falls, joint pain and myalgias.  Skin:  Negative for itching and rash.  Neurological:  Negative for dizziness, tingling, speech change, weakness and headaches.  Endo/Heme/Allergies:  Negative for polydipsia. Does not bruise/bleed easily.  Psychiatric/Behavioral:  Negative for depression and memory loss. The patient is not nervous/anxious and does not have insomnia.          Objective:  BP 110/72   Pulse 76   Ht 5\' 11"  (1.803 m)   Wt 264 lb 6.4 oz (119.9 kg)   LMP  12/09/2022   BMI 36.88 kg/m   General appearance: alert, no distress, WD/WN, African American female Skin: Moderate acne to face, no other worrisome lesions HEENT: normocephalic, conjunctiva/corneas normal, sclerae anicteric, PERRLA, EOMi, nares patent, no discharge or erythema, pharynx normal Oral cavity: MMM, tongue normal, teeth with obvious decay of several molars upper  neck: supple, no lymphadenopathy, no thyromegaly, no masses, normal ROM, no bruits Chest: non tender, normal shape and expansion Heart: RRR, normal S1, S2, no murmurs Lungs: Somewhat coarse breath sounds, no wheezes, rhonchi, or rales Abdomen: +bs, soft, non tender, non distended, no masses, no hepatomegaly, no splenomegaly, no bruits Back: non tender, normal ROM, no scoliosis Musculoskeletal: upper extremities non tender, no obvious deformity, normal ROM throughout, lower extremities non tender, no obvious deformity, normal ROM throughout Extremities: no edema, no cyanosis, no clubbing Pulses: 2+ symmetric, upper and lower extremities, normal cap refill Neurological: alert, oriented x 3, CN2-12 intact, strength normal upper extremities and lower extremities, sensation normal throughout, DTRs 2+ throughout, no cerebellar signs, gait normal Psychiatric: normal affect, behavior normal, pleasant  Breast/gyn/rectal - deferred to gynecology     Assessment and Plan :   Encounter Diagnoses  Name Primary?   Encounter for health maintenance examination in adult Yes   Need for Tdap vaccination    Encounter for screening mammogram for malignant neoplasm of breast    Essential hypertension, benign    Family history of premature CAD    Prediabetes    Tobacco use    Vaccine counseling    Anxiety    Screening for thyroid disorder    Chronic cough    Screen for STD (sexually transmitted disease)    Dental caries      This visit was a preventative care visit, also known as wellness visit or routine physical.   Topics  typically include healthy lifestyle, diet, exercise, preventative care, vaccinations, sick and well care, proper use of emergency dept and after hours care, as well as other concerns.    Separate significant issues discussed: Dental caries-see dentist soon  Hypertension-continue current medication bisoprolol HCT 5/6.25 mg daily  History of anxiety-continue Wellbutrin XL 300 mg daily  Chronic cough-abnormal PFT today.  Begin trial of Trelegy, can use albuterol rescue inhaler as needed.  Discussed difference between preventative and rescue inhaler.  Go for baseline chest x-ray  Prediabetes-updated labs today  General Recommendations: Continue to return yearly for your annual wellness and preventative care visits.  This gives [place] a chance to discuss healthy lifestyle, exercise, vaccinations, review your chart record, and perform screenings where appropriate.  I recommend you see your eye doctor yearly for routine vision care.  I recommend you see your dentist yearly for routine dental care including hygiene visits twice yearly.   Vaccination recommendations were reviewed Immunization History  Administered Date(s) Administered   Tdap 12/24/2022    Vaccine recommendations: Consider pneumococcal and flu vaccine  Vaccines administered today: Counseled on the Tdap (tetanus, diptheria, and acellular pertussis) vaccine.  Vaccine information sheet given. Tdap vaccine given after consent obtained.    Screening for cancer: Colon cancer screening: Age 61   Breast cancer screening: You should perform a self breast exam monthly.   We reviewed recommendations for regular mammograms and breast cancer screening.  Please call to schedule your mammogram.   [facility]  [phone] [address] [place], [place] [address]   Cervical cancer screening: We reviewed recommendations for pap smear screening. Last pap - return at your convenience for  this   Skin cancer screening: Check your skin regularly for new changes, growing lesions, or other lesions of concern Come in for evaluation if you have skin lesions of concern.  Lung cancer screening: If you have a greater than 20 pack year history of tobacco use, then you may qualify for lung cancer screening with a chest CT scan.   Please call your insurance company to inquire about coverage for this test.  Pancreatic cancer: no current screening test is available routinely recommended.  (Risk factors: Smoking, overweight or obese, diabetes, chronic pancreatitis, work Nurse, mental health, Solicitor, 1 year old or greater, female greater than female, African-American, family history of pancreatic cancer, hereditary breast, ovarian, melanoma, Lynch, Peutz-jeghers).  We currently don't have screenings for other cancers besides breast, cervical, colon, and lung cancers.  If you have a strong family history of cancer or have other cancer screening concerns, please let me know.    Bone health: Get at least 150 minutes of aerobic exercise weekly Get weight bearing exercise at least once weekly Bone density test:  A bone density test is an imaging test that uses a type of X-ray to measure the amount of calcium and other minerals in your bones. The test may be used to diagnose or screen you for a condition that causes weak or thin bones (osteoporosis), predict your risk for a broken bone (fracture), or determine how well your osteoporosis treatment is working. The bone density test is recommended for females 65 and older, or females or males <65 if certain risk factors such as thyroid disease, long term use of steroids such as for asthma or rheumatological issues, vitamin D deficiency, estrogen deficiency, family history of osteoporosis, self or family history of fragility fracture in first degree relative.    Heart health: Get at least 150 minutes of aerobic exercise weekly Limit alcohol It  is important to maintain a healthy blood pressure and healthy cholesterol numbers  Heart disease screening: Screening for heart disease includes screening for blood pressure, fasting lipids, glucose/diabetes screening, BMI height to weight ratio, reviewed of smoking status, physical activity, and diet.    Goals include blood pressure 120/80 or less, maintaining a healthy lipid/cholesterol profile, preventing diabetes or keeping diabetes numbers under good control, not smoking or using tobacco products, exercising most days per week or at least 150 minutes per week of exercise, and eating healthy variety of fruits and vegetables, healthy oils, and avoiding unhealthy food choices like fried food, fast food, high sugar and high cholesterol foods.    Other tests may possibly include EKG test, CT coronary calcium score, echocardiogram, exercise treadmill stress test.     Vascular disease screening: For high risk individuals including smokers, diabetes, patients with known heart disease or high blood pressure, kidney disease, and others, screening for vascular disease or atherosclerosis of the arteries is available.  Examples may include carotid ultrasound, abdominal  aortic ultrasound, ABI blood flow screening in the legs, thoracic aorta screening.    Medical care options: I recommend you continue to seek care here first for routine care.  We try really hard to have available appointments Monday through Friday daytime hours for sick visits, acute visits, and physicals.  Urgent care should be used for after hours and weekends for significant issues that cannot wait till the next day.  The emergency department should be used for significant potentially life-threatening emergencies.  The emergency department is expensive, can often have long wait times for less significant concerns, so try to utilize primary care, urgent care, or telemedicine when possible to avoid unnecessary trips to the emergency  department.  Virtual visits and telemedicine have been introduced since the pandemic started in 2020, and can be convenient ways to receive medical care.  We offer virtual appointments as well to assist you in a variety of options to seek medical care.   Legal Take the time to do a Last Will and Testament, advanced directives including Healthcare Power of Attorney and Living Will documents.  Do not leave your family with burdens that can be handled ahead of time.   Advanced Directives: I recommend you consider completing a Health Care Power of Attorney and Living Will.   These documents respect your wishes and help alleviate burdens on your loved ones if you were to become terminally ill or be in a position to need those documents enforced.    You can complete Advanced Directives yourself, have them notarized, then have copies made for our office, for you and for anybody you feel should have them in safe keeping.  Or, you can have an attorney prepare these documents.   If you haven't updated your Last Will and Testament in a while, it may be worthwhile having an attorney prepare these documents together and save on some costs.       Spiritual and Emotional Health Keeping a healthy spiritual life can help you better manage your physical health. Your spiritual life can help you to cope with any issues that may arise with your physical health.  Balance can keep [place] healthy and help [place] to recover.  If you are struggling with your spiritual health there are questions that you may want to ask yourself:  What makes me feel most complete? When do I feel most connected to the rest of the world? Where do I find the most inner strength? What am I doing when I feel whole?  Helpful tips: Being in nature. Some people feel very connected and at peace when they are walking outdoors or are outside. Helping others. Some feel the largest sense of wellbeing when they are of service to others. Being of service  can take on many forms. It can be doing volunteer work, being kind to strangers, or offering a hand to a friend in need. Gratitude. Some people find they feel the most connected when they remain grateful. They may make lists of all the things they are grateful for or say a thank you out loud for all they have.    Emotional Health Are you in tune with your emotional health?  Check out this link: http://www.marquez-love.com/   Financial Health Make sure you use a budget for your personal finances Make sure you are insured against risks (health insurance, life insurance, auto insurance, etc) Save more, spend less Set financial goals If you need help in this area, good resources include counseling through [employer]  or other community resources, have a meeting with a Social research officer, government, and a good resource is Salley Slaughter podcast    Nicole Deleon was seen today for annual exam.  Diagnoses and all orders for this visit:  Encounter for health maintenance examination in adult -     DG Chest 2 View; Future -     Comprehensive metabolic panel -     CBC with Differential/Platelet -     Lipid panel -     TSH -     Hemoglobin A1c -     Chlamydia/Gonococcus/Trichomonas, NAA -     HIV Antibody (routine testing w rflx) -     RPR -     Hepatitis C antibody -     Hepatitis B surface antigen -     POCT Urinalysis DIP (Proadvantage Device) -     MM 3D SCREENING MAMMOGRAM BILATERAL BREAST  Need for Tdap vaccination -     Tdap vaccine greater than or equal to 7yo IM  Encounter for screening mammogram for malignant neoplasm of breast -     MM 3D SCREENING MAMMOGRAM BILATERAL BREAST  Essential hypertension, benign  Family history of premature CAD  Prediabetes -     Hemoglobin A1c  Tobacco use -     DG Chest 2 View; Future  Vaccine counseling  Anxiety  Screening for thyroid disorder -     TSH  Chronic cough -     DG Chest 2 View; Future -     Spirometry with  Graph  Screen for STD (sexually transmitted disease) -     Chlamydia/Gonococcus/Trichomonas, NAA -     HIV Antibody (routine testing w rflx) -     RPR -     Hepatitis C antibody -     Hepatitis B surface antigen  Dental caries  Other orders -     Fluticasone-Umeclidin-Vilant (TRELEGY ELLIPTA) 100-62.5-25 MCG/ACT AEPB; Inhale 1 puff into the lungs daily. -     albuterol (VENTOLIN HFA) 108 (90 Base) MCG/ACT inhaler; Inhale 2 puffs into the lungs every 6 (six) hours as needed for wheezing or shortness of breath. -     bisoprolol-hydrochlorothiazide (ZIAC) 5-6.25 MG tablet; Take 1 tablet by mouth daily. -     buPROPion (WELLBUTRIN XL) 300 MG 24 hr tablet; Take 1 tablet (300 mg total) by mouth daily.     Follow-up pending labs, yearly for physical

## 2022-12-24 NOTE — Patient Instructions (Signed)
Please go to [facility] for your chest xray.   Their hours are 8am - 4:30 pm Monday - Friday.  Take your insurance card with you.  [facility] [phone]   [address] [place], [place] [address]        Please call to schedule your mammogram.   [facility]  [phone] [address], [address] [place], [place] [address]

## 2022-12-25 ENCOUNTER — Telehealth: Payer: Self-pay | Admitting: Internal Medicine

## 2022-12-25 LAB — CBC WITH DIFFERENTIAL/PLATELET
Basophils Absolute: 0.1 10*3/uL (ref 0.0–0.2)
Basos: 1 %
EOS (ABSOLUTE): 0.2 10*3/uL (ref 0.0–0.4)
Eos: 2 %
Hematocrit: 40.5 % (ref 34.0–46.6)
Hemoglobin: 12.8 g/dL (ref 11.1–15.9)
Immature Grans (Abs): 0 10*3/uL (ref 0.0–0.1)
Immature Granulocytes: 0 %
Lymphocytes Absolute: 3 10*3/uL (ref 0.7–3.1)
Lymphs: 33 %
MCH: 27.1 pg (ref 26.6–33.0)
MCHC: 31.6 g/dL (ref 31.5–35.7)
MCV: 86 fL (ref 79–97)
Monocytes Absolute: 0.8 10*3/uL (ref 0.1–0.9)
Monocytes: 8 %
Neutrophils Absolute: 5.3 10*3/uL (ref 1.4–7.0)
Neutrophils: 56 %
Platelets: 383 10*3/uL (ref 150–450)
RBC: 4.73 x10E6/uL (ref 3.77–5.28)
RDW: 13.6 % (ref 11.7–15.4)
WBC: 9.4 10*3/uL (ref 3.4–10.8)

## 2022-12-25 LAB — COMPREHENSIVE METABOLIC PANEL
ALT: 9 IU/L (ref 0–32)
AST: 12 IU/L (ref 0–40)
Albumin: 3.8 g/dL — ABNORMAL LOW (ref 3.9–4.9)
Alkaline Phosphatase: 82 IU/L (ref 44–121)
BUN/Creatinine Ratio: 11 (ref 9–23)
BUN: 7 mg/dL (ref 6–24)
Bilirubin Total: 0.3 mg/dL (ref 0.0–1.2)
CO2: 23 mmol/L (ref 20–29)
Calcium: 8.9 mg/dL (ref 8.7–10.2)
Chloride: 106 mmol/L (ref 96–106)
Creatinine, Ser: 0.64 mg/dL (ref 0.57–1.00)
Globulin, Total: 2.5 g/dL (ref 1.5–4.5)
Glucose: 83 mg/dL (ref 70–99)
Potassium: 4.6 mmol/L (ref 3.5–5.2)
Sodium: 139 mmol/L (ref 134–144)
Total Protein: 6.3 g/dL (ref 6.0–8.5)
eGFR: 114 mL/min/{1.73_m2} (ref >59)

## 2022-12-25 LAB — LIPID PANEL
Cholesterol, Total: 168 mg/dL (ref 100–199)
HDL: 49 mg/dL (ref >39)
LDL CALC COMMENT:: 3.4 ratio (ref 0.0–4.4)
LDL Chol Calc (NIH): 103 mg/dL — ABNORMAL HIGH (ref 0–99)
Triglycerides: 85 mg/dL (ref 0–149)
VLDL Cholesterol Cal: 16 mg/dL (ref 5–40)

## 2022-12-25 LAB — HEMOGLOBIN A1C
Est. average glucose Bld gHb Est-mCnc: 114 mg/dL
Hgb A1c MFr Bld: 5.6 % (ref 4.8–5.6)

## 2022-12-25 LAB — TSH: TSH: 1.41 u[IU]/mL (ref 0.450–4.500)

## 2022-12-25 LAB — RPR

## 2022-12-25 LAB — HEPATITIS B SURFACE ANTIGEN

## 2022-12-25 LAB — RPR, QUANT+TP ABS (REFLEX)
Rapid Plasma Reagin, Quant: 1:1 {titer} — ABNORMAL HIGH
T Pallidum Abs: REACTIVE — AB

## 2022-12-25 LAB — HIV ANTIBODY (ROUTINE TESTING W REFLEX)

## 2022-12-25 LAB — HEPATITIS C ANTIBODY

## 2022-12-25 NOTE — Telephone Encounter (Signed)
Pt called insurance company and insurance will cover CT chest. Can you put this in

## 2022-12-26 ENCOUNTER — Other Ambulatory Visit: Payer: Self-pay | Admitting: Medical

## 2022-12-26 DIAGNOSIS — R079 Chest pain, unspecified: Secondary | ICD-10-CM

## 2022-12-26 DIAGNOSIS — F17200 Nicotine dependence, unspecified, uncomplicated: Secondary | ICD-10-CM

## 2022-12-26 DIAGNOSIS — R053 Chronic cough: Secondary | ICD-10-CM

## 2022-12-26 LAB — CHLAMYDIA/GONOCOCCUS/TRICHOMONAS, NAA
Chlamydia by NAA: NEGATIVE
Gonococcus by NAA: NEGATIVE
Trich vag by NAA: POSITIVE — AB

## 2022-12-26 NOTE — Progress Notes (Signed)
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  This test can be positive in a couple of situations but is usually thought to be syphilis infection  Has she been treated for syphilis in the past?  If so when was the last treatment?.  Where can we get copies of prior labs for syphilis to examine?  The rest of her labs show normal blood sugar liver kidney and electrolytes, normal cholesterol, normal blood counts, normal thyroid  Negative for HIV, hepatitis B and C, negative for gonorrhea and chlamydia.  Still pending trichomonas test.  Let me know about the syphilis question.  Begin the new medicine as we just discussed and lets plan to follow-up in 3-4 weeks regarding breathing and inhaler  If no prior knowledge of syphilis exposure or infection and the recommendation would be to come in and have treatment for syphilis.  This would be an injection of Bicillin LA 2.4 million units IM weekly for 3 weeks.  We would then need to do lab monitoring over the next few months

## 2022-12-28 ENCOUNTER — Other Ambulatory Visit: Payer: Self-pay | Admitting: Medical

## 2022-12-28 MED ORDER — METRONIDAZOLE 500 MG PO TABS: ORAL_TABLET | ORAL | 0 refills | Status: DC

## 2022-12-31 ENCOUNTER — Other Ambulatory Visit (HOSPITAL_COMMUNITY): Payer: Self-pay

## 2022-12-31 ENCOUNTER — Telehealth: Payer: Self-pay

## 2022-12-31 NOTE — Telephone Encounter (Signed)
Pharmacy Patient Advocate Encounter  Received notification from [employer] [facility] that Prior Authorization for [facility] has been DENIED.  Full denial letter will be uploaded to the media tab. See denial reason below.  The Request for TRELEGY has been denied. However I called the patients plan and she was able to pick up the ''Ventolin Inhaler'' at a cost of 4.00. However the alternatives for Trelegy are listed below.    PA #/Case ID/Reference #: Key: [id number]

## 2022-12-31 NOTE — Telephone Encounter (Signed)
Pharmacy Patient Advocate Encounter   Received notification from Cover[employer]Meds that prior authorization for Trelegy Ellipta  is required/requested.   Insurance verification completed.   The patient is insured through [facility] .   Per test claim: PA required; PA submitted to above mentioned insurance via CoverMyMeds Key/confirmation #/EOC Key: [id number]   Status is pending

## 2023-01-03 ENCOUNTER — Other Ambulatory Visit: Payer: Self-pay | Admitting: Medical

## 2023-01-03 MED ORDER — FLUTICASONE-SALMETEROL 230-21 MCG/ACT IN AERO: 2.0000 | INHALATION_SPRAY | Freq: Two times a day (BID) | RESPIRATORY_TRACT | 0 refills | Status: DC

## 2023-01-03 NOTE — Telephone Encounter (Signed)
Tried to call pt but able to get through

## 2023-01-06 NOTE — Telephone Encounter (Signed)
Pt was notified.  

## 2023-01-10 ENCOUNTER — Ambulatory Visit
Admission: RE | Admit: 2023-01-10 | Discharge: 2023-01-10 | Disposition: A | Discharge status: Home / Self Care | Payer: Medicaid Other | Source: Ambulatory Visit | Attending: Medical | Admitting: Medical

## 2023-01-10 DIAGNOSIS — Z0000 Encounter for general adult medical examination without abnormal findings: Secondary | ICD-10-CM

## 2023-01-10 DIAGNOSIS — R0989 Other specified symptoms and signs involving the circulatory and respiratory systems: Secondary | ICD-10-CM | POA: Diagnosis not present

## 2023-01-10 DIAGNOSIS — R053 Chronic cough: Secondary | ICD-10-CM | POA: Diagnosis not present

## 2023-01-10 DIAGNOSIS — Z720 Tobacco use: Secondary | ICD-10-CM

## 2023-01-13 NOTE — Progress Notes (Signed)
 Results sent through MyChart

## 2023-01-16 ENCOUNTER — Other Ambulatory Visit: Payer: Self-pay | Admitting: Medical

## 2023-01-17 ENCOUNTER — Other Ambulatory Visit: Payer: Self-pay | Admitting: Medical

## 2023-01-17 NOTE — Telephone Encounter (Signed)
 Not covered by ins.

## 2023-01-20 ENCOUNTER — Telehealth: Payer: Self-pay | Admitting: Medical

## 2023-01-20 ENCOUNTER — Encounter: Payer: Self-pay | Admitting: Internal Medicine

## 2023-01-20 ENCOUNTER — Other Ambulatory Visit: Payer: Self-pay | Admitting: Medical

## 2023-01-20 NOTE — Telephone Encounter (Signed)
 Pt called & I explained the P.A. process, she was told by insurance that Trelegy would be covered after tries 2 preferred, she knew Advair was one of them, she will try Advair & let us know

## 2023-01-22 ENCOUNTER — Ambulatory Visit: Payer: Medicaid Other

## 2023-02-13 ENCOUNTER — Other Ambulatory Visit: Payer: Self-pay | Admitting: Medical

## 2023-04-01 ENCOUNTER — Ambulatory Visit: Admitting: Medical

## 2023-04-01 VITALS — BP 122/72 | HR 88 | Wt 261.6 lb

## 2023-04-01 DIAGNOSIS — W19XXXA Unspecified fall, initial encounter: Secondary | ICD-10-CM | POA: Diagnosis not present

## 2023-04-01 DIAGNOSIS — M25562 Pain in left knee: Secondary | ICD-10-CM | POA: Diagnosis not present

## 2023-04-01 DIAGNOSIS — Z9889 Other specified postprocedural states: Secondary | ICD-10-CM | POA: Diagnosis not present

## 2023-04-01 NOTE — Patient Instructions (Addendum)
 Recommendations For the next 1 to 2 weeks I would be cautious with the knee and use relative rest No crawling, no stooping, no climbing I would use cold therapy such as ice water pack 20 minutes at a time, 2-3 times a day for the next few days and anytime it seems to be flared up You can use over-the-counter ibuprofen 200 mg, 3 tablets twice a day for pain and inflammation for the next week I will reach out to orthopedics to have them contact you about a follow-up to recheck.  You likely need a knee brace which they can get you set up with there You may need an ultrasound or x-ray as well

## 2023-04-01 NOTE — Progress Notes (Signed)
 Subjective:  Nicole Deleon is a 43 y.o. female who presents for Chief Complaint  Patient presents with   Genia Hotter back in February on a step stool at home painting, twisted knee and fell on the knee that she had replacement done in 2021     She notes a fall about a month ago.  She has hx/o left knee surgery, and fell on that knee.  Leg has been swollen, in pain.   Was on a step stool painting.  Was on 3rd step, thought she was stepping down and missed step.  Felt down and left knee went out and down.  Ended up on knee.   Since then swollen and painful.   Was numb laterally for short period.   Nothing else hurts  Knee feels weak and at risk of giving out at times.  Using ice, ibuprofen some.    Manager at [employer].  No other aggravating or relieving factors.    No other c/o.  Past Medical History:  Diagnosis Date   Allergy    Anxiety    Arthritis    left knee   Chronic back pain    Depression    Headache    Hypertension    Pre-diabetes    diet controlled   SVD (spontaneous vaginal delivery)    x 3   Current Outpatient Medications on File Prior to Visit  Medication Sig Dispense Refill   bisoprolol-hydrochlorothiazide (ZIAC) 5-6.25 MG tablet Take 1 tablet by mouth daily. 90 tablet 3   buPROPion (WELLBUTRIN XL) 300 MG 24 hr tablet Take 1 tablet (300 mg total) by mouth daily. 90 tablet 1   fluticasone-salmeterol (ADVAIR HFA) 230-21 MCG/ACT inhaler Inhale 2 puffs into the lungs 2 (two) times daily. 1 each 0   Fluticasone-Umeclidin-Vilant (TRELEGY ELLIPTA) 100-62.5-25 MCG/ACT AEPB Inhale 1 puff into the lungs daily. 28 each 3   ibuprofen (ADVIL) 800 MG tablet Take 1 tablet (800 mg total) by mouth every 8 (eight) hours as needed (pain). 21 tablet 0   VENTOLIN HFA 108 (90 Base) MCG/ACT inhaler TAKE 2 PUFFS BY MOUTH EVERY 6 HOURS AS NEEDED FOR WHEEZE OR SHORTNESS OF BREATH 18 each 0   No current facility-administered medications on file prior to visit.   Past Surgical  History:  Procedure Laterality Date   no previous surgeries     PATELLA-FEMORAL ARTHROPLASTY Left 06/29/2020   Procedure: LEFT PATELLA-FEMORAL ARTHROPLASTY;  Surgeon: Cammy Copa, MD;  Location: [facility] OR;  Service: Orthopedics;  Laterality: Left;   TUBAL LIGATION       The following portions of the patient's history were reviewed and updated as appropriate: allergies, current medications, past family history, past medical history, past social history, past surgical history and problem list.  ROS Otherwise as in subjective above    Objective: BP 122/72   Pulse 88   Wt 261 lb 9.6 oz (118.7 kg)   BMI 36.49 kg/m   General appearance: alert, no distress, well developed, well nourished MSK: Left knee tender patellar tendon and lateral joint line, otherwise nontender, mild swelling over the anterior knee and lateral joint line, mild pain with knee extension and McMurray test, otherwise no laxity, no tenderness or deformity Rest of leg nontender with normal range of motion Leg neurovascularly intact    Assessment: Encounter Diagnoses  Name Primary?   Acute pain of left knee Yes   Fall, initial encounter    History of knee surgery  Plan: We discussed her injury, symptoms and concerns and exam findings.  Her pain has persisted for over a month despite some conservative treatment.  I will reach out to orthopedic see if they want to get her in soon to evaluate her as well.  In the meantime we discussed following recommendations   Recommendations For the next 1 to 2 weeks I would be cautious with the knee and use relative rest No crawling, no stooping, no climbing I would use cold therapy such as ice water pack 20 minutes at a time, 2-3 times a day for the next few days and anytime it seems to be flared up You can use over-the-counter ibuprofen 200 mg, 3 tablets twice a day for pain and inflammation for the next week I will reach out to orthopedics to have them contact  you about a follow-up to recheck.  You likely need a knee brace which they can get you set up with there You may need an ultrasound or x-ray as well   Collins was seen today for fall.  Diagnoses and all orders for this visit:  Acute pain of left knee  Fall, initial encounter  History of knee surgery    Follow up: with ortho

## 2023-04-08 ENCOUNTER — Other Ambulatory Visit: Payer: Self-pay | Admitting: Medical

## 2023-04-08 ENCOUNTER — Telehealth: Payer: Self-pay | Admitting: Medical

## 2023-04-08 MED ORDER — FLUTICASONE-SALMETEROL 230-21 MCG/ACT IN AERO: 2.0000 | INHALATION_SPRAY | Freq: Two times a day (BID) | RESPIRATORY_TRACT | 0 refills | Status: DC

## 2023-04-08 NOTE — Telephone Encounter (Signed)
Refilled Advair

## 2023-04-08 NOTE — Telephone Encounter (Signed)
 Pt lost her (ADVAIR HFA) 230-21 MCG/ACT inhaler and is asking for a new one to be sent in to  [id number] - [place], [place] - [address]   She is also scheduled for a pap tomorrow but is feeling like her period may come on, she says she will keep [place] updated. Just fyi.

## 2023-04-09 ENCOUNTER — Other Ambulatory Visit: Admitting: Medical

## 2023-04-15 ENCOUNTER — Other Ambulatory Visit (HOSPITAL_COMMUNITY)
Admission: RE | Admit: 2023-04-15 | Discharge: 2023-04-15 | Disposition: A | Discharge status: Home / Self Care | Source: Ambulatory Visit | Attending: Medical | Admitting: Medical

## 2023-04-15 ENCOUNTER — Ambulatory Visit: Admitting: Medical

## 2023-04-15 VITALS — BP 118/74 | HR 72 | Ht 71.0 in | Wt 268.0 lb

## 2023-04-15 DIAGNOSIS — Z8619 Personal history of other infectious and parasitic diseases: Secondary | ICD-10-CM | POA: Diagnosis not present

## 2023-04-15 DIAGNOSIS — Z124 Encounter for screening for malignant neoplasm of cervix: Secondary | ICD-10-CM | POA: Insufficient documentation

## 2023-04-15 DIAGNOSIS — Z1231 Encounter for screening mammogram for malignant neoplasm of breast: Secondary | ICD-10-CM

## 2023-04-15 NOTE — Progress Notes (Signed)
 Subjective:  Nicole Deleon is a 43 y.o. female who presents for Chief Complaint  Patient presents with   Gynecologic Exam    Pap only. Was unable to do pap at [facility] in Dec.      Here for cancer screening.  I saw her in December for well visit but she was unable to do a Pap at that time.  She is here for Pap smear.  She is not sure when her last Pap smear was.  Her periods are regular in general.  They are not particularly heavy.  Last menstrual period last Tuesday.  Vaginal discharge.  No pelvic pain.  No other symptoms or concerns.  She has not scheduled her mammogram yet.  The following portions of the patient's history were reviewed and updated as appropriate: allergies, current medications, past family history, past medical history, past social history, past surgical history and problem list.  ROS Otherwise as in subjective above  Objective: BP 118/74   Pulse 72   Ht 5\' 11"  (1.803 m)   Wt 268 lb (121.6 kg)   LMP 04/08/2023   SpO2 98%   BMI 37.38 kg/m   General appearance: alert, no distress, well developed, well nourished Breast: nontender, no masses or lumps, no skin changes, no nipple discharge or inversion, no axillary lymphadenopathy Gyn: Normal external genitalia without lesions, vagina with normal mucosa, cervix without lesions, no cervical motion tenderness, no abnormal vaginal discharge.  Uterus and adnexa not enlarged, nontender, no masses.  Pap performed.  Exam chaperoned by nurse.    Assessment: Encounter Diagnoses  Name Primary?   History of trichomoniasis Yes   Screening for cervical cancer    Encounter for screening mammogram for malignant neoplasm of breast      Plan: She completed treatment for trichomonas back in December 2024.  Updated Pap smear today.  I reminded her to call and schedule her mammogram.  Continue monthly breast exams at home  Nicole Deleon was seen today for gynecologic exam.  Diagnoses and all orders for this visit:  History of  trichomoniasis -     Cytology - PAP([place])  Screening for cervical cancer -     Cytology - PAP([place])  Encounter for screening mammogram for malignant neoplasm of breast    Follow up: pending pap, mammogram

## 2023-04-16 ENCOUNTER — Other Ambulatory Visit (INDEPENDENT_AMBULATORY_CARE_PROVIDER_SITE_OTHER): Payer: Self-pay

## 2023-04-16 ENCOUNTER — Encounter: Payer: Self-pay | Admitting: Orthopedic Surgery

## 2023-04-16 ENCOUNTER — Ambulatory Visit: Admitting: Orthopedic Surgery

## 2023-04-16 DIAGNOSIS — M65962 Unspecified synovitis and tenosynovitis, left lower leg: Secondary | ICD-10-CM | POA: Diagnosis not present

## 2023-04-16 DIAGNOSIS — M25562 Pain in left knee: Secondary | ICD-10-CM

## 2023-04-16 DIAGNOSIS — G8929 Other chronic pain: Secondary | ICD-10-CM

## 2023-04-16 LAB — CYTOLOGY - PAP
Adequacy: ABSENT
Comment: NEGATIVE
Comment: NEGATIVE
Diagnosis: NEGATIVE
High risk HPV: NEGATIVE
Trichomonas: NEGATIVE

## 2023-04-16 NOTE — Progress Notes (Unsigned)
 Office Visit Note   Patient: Nicole Deleon           Date of Birth: 04/07/1980           MRN: [id number] Visit Date: 04/16/2023 Requested by: Jac Canavan, PA-C [id number] [place],  [place] [address] PCP: Jac Canavan, PA-C  Subjective: Chief Complaint  Patient presents with   Left Knee - Pain    HPI: Nicole Deleon is a 43 y.o. female who presents to the office reporting left knee pain.  Patient underwent left knee patellofemoral arthroplasty 3 years ago.  She fell off a stepstool in January and landed onto the left knee.  Has reported continued pain and weakness.  This was a direct impact type of injury.  She works as a Naval architect.  Hard to stand a long period of time..                ROS: All systems reviewed are negative as they relate to the chief complaint within the history of present illness.  Patient denies fevers or chills.  Assessment & Plan: Visit Diagnoses:  1. Chronic pain of left knee     Plan: Impression is left knee pain with some extensor lag but intact patellar and quad tendon on ultrasound exam.  Radiographically the implants are still in good position.  No fracture.  Toradol injected into the left knee.  This is done after extensive prepping with alcohol Betadine and ChloraPrep.  Follow-up in 2 months for clinical recheck and decision for or against CT scanning of the knee at that time to evaluate for occult injury.  Follow-Up Instructions: No follow-ups on file.   Orders:  Orders Placed This Encounter  Procedures   XR KNEE 3 VIEW LEFT   No orders of the defined types were placed in this encounter.     Procedures: Large Joint Inj: L knee on 04/16/2023 12:31 PM Indications: diagnostic evaluation, joint swelling and pain Details: 18 G 1.5 in needle, superolateral approach  Arthrogram: No  Medications: 5 mL lidocaine 1 %; 4 mL bupivacaine 0.25 % Outcome: tolerated well, no immediate complications Procedure, treatment  alternatives, risks and benefits explained, specific risks discussed. Consent was given by the patient. Immediately prior to procedure a time out was called to verify the correct patient, procedure, equipment, support staff and site/side marked as required. Patient was prepped and draped in the usual sterile fashion.     Toradol injected 1 cc  Clinical Data: No additional findings.  Objective: Vital Signs: LMP 04/08/2023   Physical Exam:  Constitutional: Patient appears well-developed HEENT:  Head: Normocephalic Eyes:EOM are normal Neck: Normal range of motion Cardiovascular: Normal rate Pulmonary/chest: Effort normal Neurologic: Patient is alert Skin: Skin is warm Psychiatric: Patient has normal mood and affect  Ortho Exam: Ortho exam demonstrates full active and passive range of motion of the right knee.  Left knee has about a 15 degree extensor lag.  Patellar tendon and quad tendon are palpable.  No effusion.  No patellar maltracking is present.  No groin pain with internal/external rotation of the leg.  Ultrasound exam demonstrates structurally intact quad tendon and patellar tendon.  Specialty Comments:  No specialty comments available.  Imaging: No results found.   PMFS History: Patient Active Problem List   Diagnosis Date Noted   Dental caries 12/24/2022   Chronic cough 12/24/2022   Nonintractable headache 11/05/2022   Anxiety 11/05/2022   Encounter for health maintenance examination in adult 06/15/2021  Vaccine counseling 06/15/2021   Encounter for screening mammogram for malignant neoplasm of breast 06/15/2021   Family history of premature CAD 08/14/2020   Status post left partial knee replacement 06/29/2020   Prediabetes 06/23/2020   Patellofemoral arthritis of left knee 04/04/2020   Chest pain 04/04/2020   Depressed mood 04/04/2020   Essential hypertension, benign 04/04/2020   Lipoma of lower extremity 04/04/2020   Tobacco use 04/04/2020   Past Medical  History:  Diagnosis Date   Allergy    Anxiety    Arthritis    left knee   Chronic back pain    Depression    Headache    Hypertension    Pre-diabetes    diet controlled   SVD (spontaneous vaginal delivery)    x 3    Family History  Problem Relation Age of Onset   Hypertension Mother    Heart disease Father        died of MI   Hypertension Father    Hyperlipidemia Father    Multiple sclerosis Brother    Cancer Maternal Grandfather     Past Surgical History:  Procedure Laterality Date   no previous surgeries     PATELLA-FEMORAL ARTHROPLASTY Left 06/29/2020   Procedure: LEFT PATELLA-FEMORAL ARTHROPLASTY;  Surgeon: Cammy Copa, MD;  Location: [facility] OR;  Service: Orthopedics;  Laterality: Left;   TUBAL LIGATION     Social History   Occupational History   Not on file  Tobacco Use   Smoking status: Former    Current packs/day: 0.00    Average packs/day: 0.5 packs/day for 12.0 years (6.0 ttl pk-yrs)    Types: Cigarettes    Start date: 02/08/2008    Quit date: 02/08/2020    Years since quitting: 3.1   Smokeless tobacco: Never   Tobacco comments:    Pt states had last cigarette yesterday 06/27/20.  Vaping Use   Vaping status: Former  Substance and Sexual Activity   Alcohol use: Yes    Alcohol/week: 3.0 standard drinks of alcohol    Types: 3 Shots of liquor per week    Comment: occassional liquor   Drug use: Yes    Types: Marijuana    Comment: Last use was on 06/23/20   Sexual activity: Not Currently    Birth control/protection: None    Comment: Tubal Ligation

## 2023-04-16 NOTE — Progress Notes (Signed)
 Results sent through MyChart

## 2023-04-18 MED ORDER — LIDOCAINE HCL 1 % IJ SOLN
5.0000 mL | INTRAMUSCULAR | Status: AC | PRN
  Administered 2023-04-16: 5 mL

## 2023-04-18 MED ORDER — BUPIVACAINE HCL 0.25 % IJ SOLN
4.0000 mL | INTRAMUSCULAR | Status: AC | PRN
  Administered 2023-04-16: 4 mL via INTRA_ARTICULAR

## 2023-06-16 ENCOUNTER — Ambulatory Visit: Admitting: Orthopedic Surgery

## 2023-06-18 ENCOUNTER — Other Ambulatory Visit (INDEPENDENT_AMBULATORY_CARE_PROVIDER_SITE_OTHER)

## 2023-06-18 ENCOUNTER — Ambulatory Visit (INDEPENDENT_AMBULATORY_CARE_PROVIDER_SITE_OTHER): Admitting: Orthopedic Surgery

## 2023-06-18 ENCOUNTER — Encounter: Payer: Self-pay | Admitting: Orthopedic Surgery

## 2023-06-18 DIAGNOSIS — M545 Low back pain, unspecified: Secondary | ICD-10-CM

## 2023-06-18 MED ORDER — PREDNISONE 5 MG (21) PO TBPK: ORAL_TABLET | ORAL | 0 refills | Status: DC

## 2023-06-18 MED ORDER — METHOCARBAMOL 500 MG PO TABS: 500.0000 mg | ORAL_TABLET | Freq: Four times a day (QID) | ORAL | 0 refills | Status: DC

## 2023-06-18 NOTE — Progress Notes (Signed)
 Office Visit Note   Patient: Nicole Deleon           Date of Birth: [birth year]           MRN: [id number] Visit Date: 06/18/2023 Requested by: Claudene Crystal, PA-C [address] [place],  [place] [address] PCP: Claudene Crystal, PA-C  Subjective: Chief Complaint  Patient presents with   Left Knee - Follow-up    HPI: Nicole Deleon is a 43 y.o. female who presents to the office reporting low back pain.  Left knee was injected with Toradol  for 925.  That helped for a couple of weeks.  She describes now 17-month history of low back pain.  Right side worse than left.  She does report some radicular type pain down the right-hand side.  Has had to leave work on 1 occasion.  Hurts for her to sit up straight.  She uses ibuprofen  and Tylenol ..                ROS: All systems reviewed are negative as they relate to the chief complaint within the history of present illness.  Patient denies fevers or chills.  Assessment & Plan: Visit Diagnoses:  1. Acute midline low back pain, unspecified whether sciatica present     Plan: Impression is low back pain with fairly underwhelming radiographs of the lumbar spine.  Plan is Medrol  Dosepak for 6 days plus Robaxin  for muscle spasms.  Physical therapy for low back pain exercises plus a home exercise program.  6-week return with decision for or against MRI scanning of the lumbar spine at that time  Follow-Up Instructions: No follow-ups on file.   Orders:  Orders Placed This Encounter  Procedures   XR Lumbar Spine 2-3 Views   No orders of the defined types were placed in this encounter.     Procedures: No procedures performed   Clinical Data: No additional findings.  Objective: Vital Signs: There were no vitals taken for this visit.  Physical Exam:  Constitutional: Patient appears well-developed HEENT:  Head: Normocephalic Eyes:EOM are normal Neck: Normal range of motion Cardiovascular: Normal rate Pulmonary/chest: Effort  normal Neurologic: Patient is alert Skin: Skin is warm Psychiatric: Patient has normal mood and affect  Ortho Exam: Ortho exam demonstrates mildly positive nerve root tension signs on the right compared to the left.  5 out of 5 ankle dorsiflexion plantarflexion quad and hamstring strength.  Trace effusion left knee no effusion right knee.  Extensor mechanism intact bilaterally.  Mild pain with FADIR lateral bending and no trochanteric tenderness is noted.  Reflexes symmetric 0 to 1+ out of 4 bilateral patella and Achilles.  Specialty Comments:  No specialty comments available.  Imaging: XR Lumbar Spine 2-3 Views Result Date: 06/18/2023 AP lateral radiographs lumbar spine reviewed.  Normal lordosis.  No acute fracture.  No spondylolisthesis.  Only mild degenerative changes present in the lower lumbar facet joint region.  Disc spaces relatively well-maintained.    PMFS History: Patient Active Problem List   Diagnosis Date Noted   Dental caries 12/24/2022   Chronic cough 12/24/2022   Nonintractable headache 11/05/2022   Anxiety 11/05/2022   Encounter for health maintenance examination in adult 06/15/2021   Vaccine counseling 06/15/2021   Encounter for screening mammogram for malignant neoplasm of breast 06/15/2021   Family history of premature CAD 08/14/2020   Status post left partial knee replacement 06/29/2020   Prediabetes 06/23/2020   Patellofemoral arthritis of left knee 04/04/2020   Chest pain  04/04/2020   Depressed mood 04/04/2020   Essential hypertension, benign 04/04/2020   Lipoma of lower extremity 04/04/2020   Tobacco use 04/04/2020   Past Medical History:  Diagnosis Date   Allergy    Anxiety    Arthritis    left knee   Chronic back pain    Depression    Headache    Hypertension    Pre-diabetes    diet controlled   SVD (spontaneous vaginal delivery)    x 3    Family History  Problem Relation Age of Onset   Hypertension Mother    Heart disease Father         died of MI   Hypertension Father    Hyperlipidemia Father    Multiple sclerosis Brother    Cancer Maternal Grandfather     Past Surgical History:  Procedure Laterality Date   no previous surgeries     PATELLA-FEMORAL ARTHROPLASTY Left 06/29/2020   Procedure: LEFT PATELLA-FEMORAL ARTHROPLASTY;  Surgeon: Jasmine Mesi, MD;  Location: [facility] OR;  Service: Orthopedics;  Laterality: Left;   TUBAL LIGATION     Social History   Occupational History   Not on file  Tobacco Use   Smoking status: Former    Current packs/day: 0.00    Average packs/day: 0.5 packs/day for 12.0 years (6.0 ttl pk-yrs)    Types: Cigarettes    Start date: 02/08/2008    Quit date: 02/08/2020    Years since quitting: 3.3   Smokeless tobacco: Never   Tobacco comments:    Pt states had last cigarette yesterday 06/27/20.  Vaping Use   Vaping status: Former  Substance and Sexual Activity   Alcohol use: Yes    Alcohol/week: 3.0 standard drinks of alcohol    Types: 3 Shots of liquor per week    Comment: occassional liquor   Drug use: Yes    Types: Marijuana    Comment: Last use was on 06/23/20   Sexual activity: Not Currently    Birth control/protection: None    Comment: Tubal Ligation

## 2023-06-27 ENCOUNTER — Ambulatory Visit

## 2023-07-04 ENCOUNTER — Ambulatory Visit: Attending: Medical

## 2023-07-04 NOTE — Therapy (Incomplete)
 OUTPATIENT PHYSICAL THERAPY THORACOLUMBAR EVALUATION   Patient Name: Nicole Deleon MRN: [id number] DOB:26-Mar-1980, 43 y.o., female Today's Date: 07/04/2023  END OF SESSION:   Past Medical History:  Diagnosis Date   Allergy    Anxiety    Arthritis    left knee   Chronic back pain    Depression    Headache    Hypertension    Pre-diabetes    diet controlled   SVD (spontaneous vaginal delivery)    x 3   Past Surgical History:  Procedure Laterality Date   no previous surgeries     PATELLA-FEMORAL ARTHROPLASTY Left 06/29/2020   Procedure: LEFT PATELLA-FEMORAL ARTHROPLASTY;  Surgeon: Addie Cordella Hamilton, MD;  Location: [facility] OR;  Service: Orthopedics;  Laterality: Left;   TUBAL LIGATION     Patient Active Problem List   Diagnosis Date Noted   Dental caries 12/24/2022   Chronic cough 12/24/2022   Nonintractable headache 11/05/2022   Anxiety 11/05/2022   Encounter for health maintenance examination in adult 06/15/2021   Vaccine counseling 06/15/2021   Encounter for screening mammogram for malignant neoplasm of breast 06/15/2021   Family history of premature CAD 08/14/2020   Status post left partial knee replacement 06/29/2020   Prediabetes 06/23/2020   Patellofemoral arthritis of left knee 04/04/2020   Chest pain 04/04/2020   Depressed mood 04/04/2020   Essential hypertension, benign 04/04/2020   Lipoma of lower extremity 04/04/2020   Tobacco use 04/04/2020    PCP: Bulah Alm RAMAN, PA-C  REFERRING PROVIDER: Addie Cordella Hamilton, MD   REFERRING DIAG: M54.50 (ICD-10-CM) - Acute midline low back pain, unspecified whether sciatica present   Rationale for Evaluation and Treatment: Rehabilitation  THERAPY DIAG:  No diagnosis found.  ONSET DATE: ***  SUBJECTIVE:                                                                                                                                                                                           SUBJECTIVE  STATEMENT: ***  PERTINENT HISTORY:  ***  PAIN:  Are you having pain?  Yes: NPRS scale: *** Pain location: *** Pain description: *** Aggravating factors: *** Relieving factors: ***  PRECAUTIONS: {Therapy precautions:24002}  RED FLAGS: {PT Red Flags:29287}   WEIGHT BEARING RESTRICTIONS: {Yes ***/No:24003}  FALLS:  Has patient fallen in last 6 months? {fallsyesno:27318}  LIVING ENVIRONMENT: Lives with: {[facility] lives with:25569::lives with their family} Lives in: {Lives in:25570} Stairs: {opstairs:27293} Has following equipment at home: {Assistive devices:23999}  OCCUPATION: ***  PLOF: {PLOF:24004}  PATIENT GOALS: ***  NEXT MD VISIT: ***  OBJECTIVE:  Note: Objective measures were completed at Evaluation unless otherwise  noted.  DIAGNOSTIC FINDINGS:  ***  PATIENT SURVEYS:  {rehab surveys:24030}  COGNITION: Overall cognitive status: {cognition:24006}     SENSATION: {sensation:27233}  MUSCLE LENGTH: Hamstrings: Right *** deg; Left *** deg Debby test: Right *** deg; Left *** deg  POSTURE: {posture:25561}  PALPATION: ***  LUMBAR ROM:   AROM eval  Flexion   Extension   Right lateral flexion   Left lateral flexion   Right rotation   Left rotation    (Blank rows = not tested)  LOWER EXTREMITY ROM:     {AROM/PROM:27142}  Right eval Left eval  Hip flexion    Hip extension    Hip abduction    Hip adduction    Hip internal rotation    Hip external rotation    Knee flexion    Knee extension    Ankle dorsiflexion    Ankle plantarflexion    Ankle inversion    Ankle eversion     (Blank rows = not tested)  LOWER EXTREMITY MMT:    MMT Right eval Left eval  Hip flexion    Hip extension    Hip abduction    Hip adduction    Hip internal rotation    Hip external rotation    Knee flexion    Knee extension    Ankle dorsiflexion    Ankle plantarflexion    Ankle inversion    Ankle eversion     (Blank rows = not tested)  LUMBAR SPECIAL  TESTS:  {lumbar special test:25242}  FUNCTIONAL TESTS:  {Functional tests:24029}  GAIT: Distance walked: *** Assistive device utilized: {Assistive devices:23999} Level of assistance: {Levels of assistance:24026} Comments: ***  TREATMENT: [facility] Adult PT Treatment:                                                DATE: *** Therapeutic Exercise: *** Manual Therapy: *** Neuromuscular re-ed: *** Therapeutic Activity: *** Modalities: *** Self Care: ***  PATIENT EDUCATION:  Education details: *** Person educated: {Person educated:25204} Education method: {Education Method:25205} Education comprehension: {Education Comprehension:25206}  HOME EXERCISE PROGRAM: ***  ASSESSMENT:  CLINICAL IMPRESSION: Patient is a *** y.o. *** who was seen today for physical therapy evaluation and treatment for ***.   OBJECTIVE IMPAIRMENTS: {opptimpairments:25111}.   ACTIVITY LIMITATIONS: {activitylimitations:27494}  PARTICIPATION LIMITATIONS: {participationrestrictions:25113}  PERSONAL FACTORS: {Personal factors:25162} are also affecting patient's functional outcome.   REHAB POTENTIAL: {rehabpotential:25112}  CLINICAL DECISION MAKING: {clinical decision making:25114}  EVALUATION COMPLEXITY: {Evaluation complexity:25115}   GOALS: Goals reviewed with patient? No  SHORT TERM GOALS: Target date: 07/25/2023   Pt will be compliant and knowledgeable with initial HEP for improved comfort and carryover Baseline: initial HEP given  Goal status: INITIAL  2.  Pt will self report *** pain no greater than ***/10 for improved comfort and functional ability Baseline: ***/10 at worst Goal status: {GOALSTATUS:25110}   LONG TERM GOALS: Target date: 08/29/2023   Pt will be decrease ODI disability score to no greater than ***% as proxy for functional improvement Baseline: ***% disability  Goal status: INITIAL  2.  Pt will self report *** pain no greater than ***/10 for improved comfort and  functional ability Baseline: ***/10 at worst Goal status: {GOALSTATUS:25110}   3.  *** Baseline:  Goal status: INITIAL  4.  *** Baseline:  Goal status: INITIAL  5.  *** Baseline:  Goal status: INITIAL  PLAN:  PT FREQUENCY: {rehab frequency:25116}  PT DURATION: {rehab duration:25117}  PLANNED INTERVENTIONS: {rehab planned interventions:25118::97110-Therapeutic exercises,97530- Therapeutic [id number]- Neuromuscular re-education,97535- Self Rjmz,02859- Manual therapy}  PLAN FOR NEXT SESSION: PIERRETTE Alm JAYSON Johna, PT 07/04/2023, 7:58 AM

## 2023-07-30 ENCOUNTER — Ambulatory Visit: Admitting: Orthopedic Surgery

## 2023-07-30 DIAGNOSIS — Z0120 Encounter for dental examination and cleaning without abnormal findings: Secondary | ICD-10-CM | POA: Diagnosis not present

## 2023-07-30 DIAGNOSIS — K029 Dental caries, unspecified: Secondary | ICD-10-CM | POA: Diagnosis not present

## 2023-08-20 ENCOUNTER — Ambulatory Visit (INDEPENDENT_AMBULATORY_CARE_PROVIDER_SITE_OTHER): Admitting: Orthopedic Surgery

## 2023-08-20 DIAGNOSIS — M545 Low back pain, unspecified: Secondary | ICD-10-CM | POA: Diagnosis not present

## 2023-08-21 ENCOUNTER — Encounter: Payer: Self-pay | Admitting: Orthopedic Surgery

## 2023-08-21 ENCOUNTER — Other Ambulatory Visit: Payer: Self-pay | Admitting: Medical

## 2023-08-21 DIAGNOSIS — Z1231 Encounter for screening mammogram for malignant neoplasm of breast: Secondary | ICD-10-CM

## 2023-08-21 NOTE — Progress Notes (Signed)
 Office Visit Note   Patient: Nicole Deleon           Date of Birth: 1980-03-02           MRN: [id number] Visit Date: 08/20/2023 Requested by: Bulah Alm RAMAN, PA-C [address] [place],  [place] [address] PCP: Bulah Alm RAMAN, PA-C  Subjective: Chief Complaint  Patient presents with   Lower Back - Follow-up, Pain    HPI: Nicole Deleon is a 43 y.o. female who presents to the office reporting low back pain.  Patient states she still having right-sided lower flank pain as well as some left-sided leg radicular symptoms.  Hard for her to get up out of the car and up out of the seat.  States she is having some left leg weakness.  Uses a cane from time to time.  She has done a home exercise program with physical therapy exercises.  Also has used and taken a Medrol Dosepak which gave her only temporary relief.  Symptoms ongoing now for longer than 3 months.  She still working at the school as a Dance movement psychotherapist..                ROS: All systems reviewed are negative as they relate to the chief complaint within the history of present illness.  Patient denies fevers or chills.  Assessment & Plan: Visit Diagnoses:  1. Acute midline low back pain, unspecified whether sciatica present     Plan: Impression is low back pain with left-sided radiculopathy and a little bit of hip flexion weakness.  MRI scan indicated to evaluate probable nerve root compression with possible ESI's to follow.  I can either call her she can follow-up after that scan so we can get her set up to see Dr. Eldonna.  Follow-Up Instructions: No follow-ups on file.   Orders:  Orders Placed This Encounter  Procedures   MR Lumbar Spine w/o contrast   No orders of the defined types were placed in this encounter.     Procedures: No procedures performed   Clinical Data: No additional findings.  Objective: Vital Signs: There were no vitals taken for this visit.  Physical Exam:  Constitutional: Patient appears  well-developed HEENT:  Head: Normocephalic Eyes:EOM are normal Neck: Normal range of motion Cardiovascular: Normal rate Pulmonary/chest: Effort normal Neurologic: Patient is alert Skin: Skin is warm Psychiatric: Patient has normal mood and affect  Ortho Exam: Ortho exam demonstrates 5 out of 5 ankle dorsiflexion plantarflexion quad and hamstring strength on the left right hand side but hip flexion strength is 5-5 on the left.  5+ out of 5 on the right.  Left to much in terms of muscle atrophy right versus left.  Well-healed surgical incision on the left knee from patellofemoral replacement.  No groin pain with internal/external rotation of the leg no nerve root tension signs.  No definite paresthesias L1-S1 bilaterally.  Reflexes symmetric 0 to 1+ out of 4 bilateral patella and Achilles.  Specialty Comments:  No specialty comments available.  Imaging: No results found.   PMFS History: Patient Active Problem List   Diagnosis Date Noted   Dental caries 12/24/2022   Chronic cough 12/24/2022   Nonintractable headache 11/05/2022   Anxiety 11/05/2022   Encounter for health maintenance examination in adult 06/15/2021   Vaccine counseling 06/15/2021   Encounter for screening mammogram for malignant neoplasm of breast 06/15/2021   Family history of premature CAD 08/14/2020   Status post left partial knee replacement 06/29/2020  Prediabetes 06/23/2020   Patellofemoral arthritis of left knee 04/04/2020   Chest pain 04/04/2020   Depressed mood 04/04/2020   Essential hypertension, benign 04/04/2020   Lipoma of lower extremity 04/04/2020   Tobacco use 04/04/2020   Past Medical History:  Diagnosis Date   Allergy    Anxiety    Arthritis    left knee   Chronic back pain    Depression    Headache    Hypertension    Pre-diabetes    diet controlled   SVD (spontaneous vaginal delivery)    x 3    Family History  Problem Relation Age of Onset   Hypertension Mother    Heart disease  Father        died of MI   Hypertension Father    Hyperlipidemia Father    Multiple sclerosis Brother    Cancer Maternal Grandfather     Past Surgical History:  Procedure Laterality Date   no previous surgeries     PATELLA-FEMORAL ARTHROPLASTY Left 06/29/2020   Procedure: LEFT PATELLA-FEMORAL ARTHROPLASTY;  Surgeon: Addie Cordella Hamilton, MD;  Location: [facility] OR;  Service: Orthopedics;  Laterality: Left;   TUBAL LIGATION     Social History   Occupational History   Not on file  Tobacco Use   Smoking status: Former    Current packs/day: 0.00    Average packs/day: 0.5 packs/day for 12.0 years (6.0 ttl pk-yrs)    Types: Cigarettes    Start date: 02/08/2008    Quit date: 02/08/2020    Years since quitting: 3.5   Smokeless tobacco: Never   Tobacco comments:    Pt states had last cigarette yesterday 06/27/20.  Vaping Use   Vaping status: Former  Substance and Sexual Activity   Alcohol use: Yes    Alcohol/week: 3.0 standard drinks of alcohol    Types: 3 Shots of liquor per week    Comment: occassional liquor   Drug use: Yes    Types: Marijuana    Comment: Last use was on 06/23/20   Sexual activity: Not Currently    Birth control/protection: None    Comment: Tubal Ligation

## 2023-08-22 ENCOUNTER — Ambulatory Visit

## 2023-08-25 ENCOUNTER — Ambulatory Visit

## 2023-08-28 ENCOUNTER — Other Ambulatory Visit

## 2023-09-09 ENCOUNTER — Encounter: Payer: Self-pay | Admitting: Orthopedic Surgery

## 2023-09-10 ENCOUNTER — Ambulatory Visit: Payer: Self-pay | Admitting: Orthopedic Surgery

## 2023-09-10 ENCOUNTER — Inpatient Hospital Stay
Admission: RE | Admit: 2023-09-10 | Discharge: 2023-09-10 | Discharge status: Home / Self Care | Source: Ambulatory Visit | Attending: Orthopedic Surgery | Admitting: Orthopedic Surgery

## 2023-09-10 ENCOUNTER — Other Ambulatory Visit: Payer: Self-pay

## 2023-09-10 DIAGNOSIS — M545 Low back pain, unspecified: Secondary | ICD-10-CM

## 2023-09-10 NOTE — Progress Notes (Signed)
 Referral placed.

## 2023-09-10 NOTE — Progress Notes (Signed)
 I left message on machine describing her findings of the MRI scan.  Please have her see Dr. Eldonna for further evaluation and management of her back related symptoms thank you I think she would do well with an ESI

## 2023-10-09 ENCOUNTER — Encounter: Admitting: Physical Medicine and Rehabilitation

## 2023-10-20 ENCOUNTER — Other Ambulatory Visit: Payer: Self-pay

## 2023-10-20 ENCOUNTER — Ambulatory Visit: Admitting: Physical Medicine and Rehabilitation

## 2023-10-20 VITALS — BP 130/93 | HR 93

## 2023-10-20 DIAGNOSIS — M5416 Radiculopathy, lumbar region: Secondary | ICD-10-CM | POA: Diagnosis not present

## 2023-10-20 MED ORDER — METHYLPREDNISOLONE ACETATE 80 MG/ML IJ SUSP
40.0000 mg | Freq: Once | INTRAMUSCULAR | Status: AC
  Administered 2023-10-20: 40 mg

## 2023-10-20 NOTE — Progress Notes (Signed)
 Pain Scale   Average Pain 10 Patient advising she has lower back pain radiating bilateral to legs and pain is constant.        +Driver, -BT, -Dye Allergies.

## 2023-10-21 ENCOUNTER — Ambulatory Visit: Payer: Self-pay

## 2023-10-21 NOTE — Telephone Encounter (Signed)
 Copied from CRM [id number]. Topic: Clinical - Medication Question >> Oct 21, 2023 10:32 AM Ahlexyia S wrote: Reason for CRM: Pt called in stating that she now needs to start back having an inhaler. Pt has been having trouble breathing very frequently. Pt also isnt too sure the name of the inhaler. Pt would like a callback when this is being worked on.   Nurse called and spoke with patient: pt stated she now has an appt after speaking with another nurse: verified in chart that triage and appt address by NT

## 2023-10-21 NOTE — Telephone Encounter (Signed)
 We have openings today. Please offer sooner appts than 10/16

## 2023-10-21 NOTE — Telephone Encounter (Signed)
 FYI Only or Action Required?: FYI only for provider.  Patient was last seen in primary care on 04/15/2023 by Bulah Alm RAMAN, PA-C.  Called Nurse Triage reporting Shortness of Breath.  Symptoms began several weeks ago.  Interventions attempted: Rest, hydration, or home remedies.  Symptoms are: gradually worsening.  Triage Disposition: See PCP When Office is Open (Within 3 Days)  Patient/caregiver understands and will follow disposition?: Yes  Copied from CRM [id number]. Topic: Clinical - Red Word Triage >> Oct 21, 2023 10:34 AM Ahlexyia S wrote: Red Word that prompted transfer to Nurse Triage: Pt called in stating she has been having trouble breathing and it has been happening very frequently. Warm transferred to nurse triage. Reason for Disposition  [1] MODERATE longstanding difficulty breathing (e.g., speaks in phrases, SOB even at rest, pulse 100-120) AND [2] SAME as normal  Answer Assessment - Initial Assessment Questions Patient has run out of inhalers at least a month ago  1. RESPIRATORY STATUS: Describe your breathing? (e.g., wheezing, shortness of breath, unable to speak, severe coughing)      Short of breath at times, coung 2. ONSET: When did this breathing problem begin?      A couple of weeks 3. PATTERN Does the difficult breathing come and go, or has it been constant since it started?      SOB at rest and with minimal activity, wakes up coughing 4. SEVERITY: How bad is your breathing? (e.g., mild, moderate, severe)      Moderate at worse 5. RECURRENT SYMPTOM: Have you had difficulty breathing before? If Yes, ask: When was the last time? and What happened that time?      Yes, normally inhaler helps 6. CARDIAC HISTORY: Do you have any history of heart disease? (e.g., heart attack, angina, bypass surgery, angioplasty)      denies 7. LUNG HISTORY: Do you have any history of lung disease?  (e.g., pulmonary embolus, asthma, emphysema)     COPD 8. CAUSE:  What do you think is causing the breathing problem?      Out of inhaler, possibly change of season 9. OTHER SYMPTOMS: Do you have any other symptoms? (e.g., chest pain, cough, dizziness, fever, runny nose)     denies 10. O2 SATURATION MONITOR:  Do you use an oxygen saturation monitor (pulse oximeter) at home? If Yes, ask: What is your reading (oxygen level) today? What is your usual oxygen saturation reading? (e.g., 95%)       Not monitored 11. PREGNANCY: Is there any chance you are pregnant? When was your last menstrual period?       denies 49. TRAVEL: Have you traveled out of the country in the last month? (e.g., travel history, exposures)       denies  Protocols used: Breathing Difficulty-A-AH

## 2023-10-22 ENCOUNTER — Ambulatory Visit: Admitting: Medical

## 2023-10-22 VITALS — BP 120/70 | HR 88 | Temp 98.8°F | Resp 16 | Wt 275.0 lb

## 2023-10-22 DIAGNOSIS — R053 Chronic cough: Secondary | ICD-10-CM

## 2023-10-22 DIAGNOSIS — R0602 Shortness of breath: Secondary | ICD-10-CM

## 2023-10-22 DIAGNOSIS — R0789 Other chest pain: Secondary | ICD-10-CM

## 2023-10-22 DIAGNOSIS — F17200 Nicotine dependence, unspecified, uncomplicated: Secondary | ICD-10-CM

## 2023-10-22 MED ORDER — BREZTRI AEROSPHERE 160-9-4.8 MCG/ACT IN AERO: 2.0000 | INHALATION_SPRAY | Freq: Two times a day (BID) | RESPIRATORY_TRACT | 2 refills | Status: AC

## 2023-10-22 MED ORDER — ALBUTEROL SULFATE HFA 108 (90 BASE) MCG/ACT IN AERS: INHALATION_SPRAY | RESPIRATORY_TRACT | 1 refills | Status: AC

## 2023-10-22 MED ORDER — FAMOTIDINE 20 MG PO TABS: 20.0000 mg | ORAL_TABLET | Freq: Two times a day (BID) | ORAL | 0 refills | Status: DC

## 2023-10-22 MED ORDER — BUPROPION HCL ER (XL) 300 MG PO TB24: 300.0000 mg | ORAL_TABLET | Freq: Every day | ORAL | 1 refills | Status: AC

## 2023-10-22 NOTE — Progress Notes (Signed)
 Subjective: Chief Complaint  Patient presents with   Follow-up    Follow-up with SOB, out of inhalers   History of Present Illness Nicole Deleon is a 43 year old female who presents with shortness of breath and cough.  She experiences shortness of breath even while sitting, with sensations of constriction, exacerbated by cold environments. She has used inhalers in the past, finding some relief, particularly with a blue inhaler starting with 'D'.  Her cough has persisted for about a year, occurring without specific triggers and sometimes leading to choking sensations. She also experiences shortness of breath during sleep, waking up coughing and breathless.  She has a history of smoking since the age of 52 or 78 and has recently attempted to reduce her smoking. She recalls a previous mention of possible COPD.  She reports occasional chest pain and leg swelling. No current allergy symptoms such as runny nose, sneezing, or itchy ears, but she does mention having itchy eyes.  Her current medications include Bisoprolol  HCT 5/6.25mg  daily  Past Medical History:  Diagnosis Date   Allergy    Anxiety    Arthritis    left knee   Chronic back pain    Depression    Headache    Hypertension    Pre-diabetes    diet controlled   SVD (spontaneous vaginal delivery)    x 3   Current Outpatient Medications on File Prior to Visit  Medication Sig Dispense Refill   bisoprolol -hydrochlorothiazide  (ZIAC ) 5-6.25 MG tablet Take 1 tablet by mouth daily. 90 tablet 3   ibuprofen  (ADVIL ) 800 MG tablet Take 1 tablet (800 mg total) by mouth every 8 (eight) hours as needed (pain). 21 tablet 0   Current Facility-Administered Medications on File Prior to Visit  Medication Dose Route Frequency Provider Last Rate Last Admin   methylPREDNISolone  acetate (DEPO-MEDROL ) injection 40 mg  40 mg Other Once        Family History  Problem Relation Age of Onset   Hypertension Mother    Heart disease Father         died of MI   Hypertension Father    Hyperlipidemia Father    Multiple sclerosis Brother    Cancer Maternal Grandfather     ROS as in subjective   Objective BP 120/70   Pulse 88   Temp 98.8 F (37.1 C)   Resp 16   Wt 275 lb (124.7 kg)   SpO2 97%   BMI 38.35 kg/m   General appearence: alert, no distress, WD/WN,  HEENT: normocephalic, sclerae anicteric, TMs pearly, nares with some mild clear discharge, no erythema, pharynx normal Oral cavity: MMM, no lesions Neck: supple, no lymphadenopathy, no thyromegaly, no masses Heart: RRR, normal S1, S2, no murmurs Lungs: decreased lower fields, otherwise clear, no wheezes, rhonchi, or rales Pulses: 2+ symmetric, upper and lower extremities, normal cap refill   EKG reviewed    Assessment and Plan Encounter Diagnoses  Name Primary?   SOB (shortness of breath) Yes   Chronic cough    Chest discomfort    Smoker      Chronic cough and dyspnea, possible COPD Chronic cough and dyspnea with abnormal breathing test suggest possible COPD. Symptoms and test results atypical for asthma. - Perform EKG to rule out cardiac causes of dyspnea. - Attempt breathing test to compare with previous abnormal results. - Consider referral to pulmonologist if breathing test remains abnormal. -begin back on maintenance and rescue inhalers -discussed proper use of inhalers  Tobacco use disorder Long-term tobacco use since age [age]. Smoking contributes to respiratory symptoms and potential COPD. -advised cessation  Possible Gastroesophageal reflux disease (GERD) Heartburn may contribute to chronic cough and dyspnea. -begin trial of famotidine  Obesity Weight gain may contribute to respiratory symptoms. -work on efforts to lose weight    Recommendations: Begin back on MAINTENANCE inhaler Breztri 2 puffs twice daily for help with breathing.  Rinse mouth out with water I prescribed emergency rescue inhaler Albuterol  to use as needed, 2 puffs  every 4-6 hours AS NEEDED for shortness of breath.  Use this in addition to the daily Breztri  Begin famotidine pepcid 20mg  once or twice daily for possible acid reflux which could contribute to cough Work on efforts to quit tobacco Work on efforts to loose weight as this can help with breathing Go for updated chest xray to rule out worrisome findings Consider medication to help with weight loss  Please go to [facility] for your chest xray.   Their hours are 8am - 4:30 pm Monday - Friday.  Take your insurance card with you.  [facility] [phone]  [address] [place], [place] [address]  [name] was seen today for follow-up.  Diagnoses and all orders for this visit:  SOB (shortness of breath) -     Spirometry with Graph -     DG Chest 2 View; Future -     EKG 12-Lead  Chronic cough -     DG Chest 2 View; Future  Chest discomfort -     EKG 12-Lead  Smoker  Other orders -     albuterol  (VENTOLIN  HFA) 108 (90 Base) MCG/ACT inhaler; TAKE 2 PUFFS BY MOUTH EVERY 6 HOURS AS NEEDED FOR WHEEZE OR SHORTNESS OF BREATH -     budesonide-glycopyrrolate-formoterol (BREZTRI AEROSPHERE) 160-9-4.8 MCG/ACT AERO inhaler; Inhale 2 puffs into the lungs 2 (two) times daily. -     buPROPion  (WELLBUTRIN  XL) 300 MG 24 hr tablet; Take 1 tablet (300 mg total) by mouth daily. -     famotidine (PEPCID) 20 MG tablet; Take 1 tablet (20 mg total) by mouth 2 (two) times daily.    F/u pending chest xray

## 2023-10-22 NOTE — Patient Instructions (Signed)
 Recommendations: Begin back on MAINTENANCE inhaler Breztri 2 puffs twice daily for help with breathing.  Rinse mouth out with water I prescribed emergency rescue inhaler Albuterol  to use as needed, 2 puffs every 4-6 hours AS NEEDED for shortness of breath.  Use this in addition to the daily Breztri  Begin famotidine pepcid 20mg  once or twice daily for possible acid reflux which could contribute to cough Work on efforts to quit tobacco Work on efforts to loose weight as this can help with breathing Go for updated chest xray to rule out worrisome findings Consider medication to help with weight loss  Please go to [facility] for your chest xray.   Their hours are 8am - 4:30 pm Monday - Friday.  Take your insurance card with you.  [facility] [phone]  [address] [place], [place] [address]

## 2023-10-23 ENCOUNTER — Ambulatory Visit: Admitting: Medical

## 2023-10-27 NOTE — Procedures (Signed)
 Lumbar Epidural Steroid Injection - Interlaminar Approach with Fluoroscopic Guidance  Patient: Nicole Deleon      Date of Birth: 1980-11-25 MRN: [id number] PCP: Bulah Alm RAMAN, PA-C      Visit Date: 10/20/2023   Universal Protocol:     Consent Given By: the patient  Position: PRONE  Additional Comments: Vital signs were monitored before and after the procedure. Patient was prepped and draped in the usual sterile fashion. The correct patient, procedure, and site was verified.   Injection Procedure Details:   Procedure diagnoses: Lumbar radiculopathy [M54.16]   Meds Administered:  Meds ordered this encounter  Medications   methylPREDNISolone  acetate (DEPO-MEDROL ) injection 40 mg     Laterality: Left  Location/Site:  L5-S1  Needle: 4.5 in., 20 ga. Tuohy  Needle Placement: Paramedian epidural  Findings:   -Comments: Excellent flow of contrast into the epidural space.  Procedure Details: Using a paramedian approach from the side mentioned above, the region overlying the inferior lamina was localized under fluoroscopic visualization and the soft tissues overlying this structure were infiltrated with 4 ml. of 1% Lidocaine  without Epinephrine . The Tuohy needle was inserted into the epidural space using a paramedian approach.   The epidural space was localized using loss of resistance along with counter oblique bi-planar fluoroscopic views.  After negative aspirate for air, blood, and CSF, a 2 ml. volume of Isovue-250 was injected into the epidural space and the flow of contrast was observed. Radiographs were obtained for documentation purposes.    The injectate was administered into the level noted above.   Additional Comments:  The patient tolerated the procedure well Dressing: 2 x 2 sterile gauze and Band-Aid    Post-procedure details: Patient was observed during the procedure. Post-procedure instructions were reviewed.  Patient left the clinic in stable  condition.

## 2023-10-27 NOTE — Progress Notes (Signed)
 Nicole Deleon - 43 y.o. female MRN [id number]  Date of birth: [birth year]  Office Visit Note: Visit Date: 10/20/2023 PCP: Bulah Alm RAMAN, PA-C Referred by: Bulah Alm RAMAN, PA-C  Subjective: Chief Complaint  Patient presents with   Lower Back - Pain   HPI:  Nicole Deleon is a 43 y.o. female who comes in today at the request of Dr. JUDITHANN Glendia Hutchinson for planned Left L5-S1 Lumbar Interlaminar epidural steroid injection with fluoroscopic guidance.  The patient has failed conservative care including home exercise, medications, time and activity modification.  This injection will be diagnostic and hopefully therapeutic.  Please see requesting physician notes for further details and justification.   ROS Otherwise per HPI.  Assessment & Plan: Visit Diagnoses:    ICD-10-CM   1. Lumbar radiculopathy  M54.16 XR C-ARM NO REPORT    Epidural Steroid injection    methylPREDNISolone  acetate (DEPO-MEDROL ) injection 40 mg      Plan: No additional findings.   Meds & Orders:  Meds ordered this encounter  Medications   methylPREDNISolone  acetate (DEPO-MEDROL ) injection 40 mg    Orders Placed This Encounter  Procedures   XR C-ARM NO REPORT   Epidural Steroid injection    Follow-up: Return for visit to requesting provider as needed.   Procedures: No procedures performed  Lumbar Epidural Steroid Injection - Interlaminar Approach with Fluoroscopic Guidance  Patient: Nicole Deleon      Date of Birth: 1980-11-28 MRN: [id number] PCP: Bulah Alm RAMAN, PA-C      Visit Date: 10/20/2023   Universal Protocol:     Consent Given By: the patient  Position: PRONE  Additional Comments: Vital signs were monitored before and after the procedure. Patient was prepped and draped in the usual sterile fashion. The correct patient, procedure, and site was verified.   Injection Procedure Details:   Procedure diagnoses: Lumbar radiculopathy [M54.16]   Meds Administered:  Meds ordered  this encounter  Medications   methylPREDNISolone  acetate (DEPO-MEDROL ) injection 40 mg     Laterality: Left  Location/Site:  L5-S1  Needle: 4.5 in., 20 ga. Tuohy  Needle Placement: Paramedian epidural  Findings:   -Comments: Excellent flow of contrast into the epidural space.  Procedure Details: Using a paramedian approach from the side mentioned above, the region overlying the inferior lamina was localized under fluoroscopic visualization and the soft tissues overlying this structure were infiltrated with 4 ml. of 1% Lidocaine  without Epinephrine . The Tuohy needle was inserted into the epidural space using a paramedian approach.   The epidural space was localized using loss of resistance along with counter oblique bi-planar fluoroscopic views.  After negative aspirate for air, blood, and CSF, a 2 ml. volume of Isovue-250 was injected into the epidural space and the flow of contrast was observed. Radiographs were obtained for documentation purposes.    The injectate was administered into the level noted above.   Additional Comments:  The patient tolerated the procedure well Dressing: 2 x 2 sterile gauze and Band-Aid    Post-procedure details: Patient was observed during the procedure. Post-procedure instructions were reviewed.  Patient left the clinic in stable condition.   Clinical History: MR LUMBAR SPINE WITHOUT IV CONTRAST   COMPARISON: None available   CLINICAL HISTORY: Radiculopathy.   TECHNIQUE: SAG T2, SAG T1, SAG STIR, AX T2, AX T1 without IV contrast.   FINDINGS: There is normal alignment of the lumbar spine. Mild facet arthrosis is seen at L4-5 and to a lesser extent L3-4. There  is no vertebral body height loss, subluxation or marrow replacing process. The sacrum and SI joints are unremarkable so far as visualized. Conus and cauda equina are unremarkable.   T12-L1: There is no focal disc protrusion, foraminal or spinal stenosis. Mild facet arthrosis.    L1-2: There is no focal disc protrusion, foraminal or spinal stenosis.   L2-3: Mild disc desiccation without focal extrusion. There is slight caudal foraminal narrowing without impingement of the exiting L2 nerves. Mild facet arthrosis   L3-4: There is no focal disc protrusion, foraminal or spinal stenosis.   L4-5: There is no focal disc protrusion, foraminal or spinal stenosis. Mild facet arthrosis.   L5-S1: There is no focal disc protrusion, foraminal or spinal stenosis. Mild-to-moderate facet arthrosis.   The retroperitoneal structures demonstrate no significant abnormality.   IMPRESSION: Mild facet arthrosis most notably at L4-5 with mild facet edema. There is no high-grade foraminal or spinal stenosis. There is mild foraminal narrowing at L2-3 without impingement of the exiting L2 nerves. No significant spinal stenosis. See above for more detail at each individual level.   Electronically signed by: Norleen Satchel MD 09/10/2023 04:03 PM EDT RP Workstation: [id number]     Objective:  VS:  HT:    WT:   BMI:     BP:(!) 130/93  HR:93bpm  TEMP: ( )  RESP:  Physical Exam Vitals and nursing note reviewed.  Constitutional:      General: She is not in acute distress.    Appearance: Normal appearance. She is obese. She is not ill-appearing.  HENT:     Head: Normocephalic and atraumatic.     Right Ear: External ear normal.     Left Ear: External ear normal.  Eyes:     Extraocular Movements: Extraocular movements intact.  Cardiovascular:     Rate and Rhythm: Normal rate.     Pulses: Normal pulses.  Pulmonary:     Effort: Pulmonary effort is normal. No respiratory distress.  Abdominal:     General: There is no distension.     Palpations: Abdomen is soft.  Musculoskeletal:        General: Tenderness present.     Cervical back: Neck supple.     Right lower leg: No edema.     Left lower leg: No edema.     Comments: Patient has good distal strength with no pain over the  greater trochanters.  No clonus or focal weakness.  Skin:    Findings: No erythema, lesion or rash.  Neurological:     General: No focal deficit present.     Mental Status: She is alert and oriented to person, place, and time.     Sensory: No sensory deficit.     Motor: No weakness or abnormal muscle tone.     Coordination: Coordination normal.  Psychiatric:        Mood and Affect: Mood normal.        Behavior: Behavior normal.      Imaging: No results found.

## 2023-11-03 ENCOUNTER — Ambulatory Visit
Admission: RE | Admit: 2023-11-03 | Discharge: 2023-11-03 | Disposition: A | Source: Ambulatory Visit | Attending: Medical | Admitting: Medical

## 2023-11-03 DIAGNOSIS — R059 Cough, unspecified: Secondary | ICD-10-CM | POA: Diagnosis not present

## 2023-11-03 DIAGNOSIS — R0602 Shortness of breath: Secondary | ICD-10-CM | POA: Diagnosis not present

## 2023-11-03 DIAGNOSIS — R053 Chronic cough: Secondary | ICD-10-CM

## 2023-11-05 ENCOUNTER — Ambulatory Visit: Payer: Self-pay | Admitting: Medical

## 2023-11-05 NOTE — Progress Notes (Signed)
 Results through MyChart

## 2023-11-09 DIAGNOSIS — M25562 Pain in left knee: Secondary | ICD-10-CM | POA: Diagnosis not present

## 2023-11-10 ENCOUNTER — Encounter: Payer: Self-pay | Admitting: Radiology

## 2023-11-15 ENCOUNTER — Other Ambulatory Visit: Payer: Self-pay | Admitting: Medical

## 2023-12-11 ENCOUNTER — Telehealth: Payer: Self-pay | Admitting: Orthopedic Surgery

## 2023-12-11 NOTE — Telephone Encounter (Signed)
 Called pt per Addie request for pt to r/s appt. Laeft vm

## 2023-12-12 ENCOUNTER — Ambulatory Visit: Admitting: Orthopedic Surgery

## 2023-12-15 ENCOUNTER — Ambulatory Visit: Admitting: Podiatry

## 2023-12-15 DIAGNOSIS — B351 Tinea unguium: Secondary | ICD-10-CM

## 2023-12-22 NOTE — Progress Notes (Signed)
°  °  Subjective:  Patient ID: Nicole Deleon, female    DOB: 01/14/1980,  MRN: [id number]  Chief Complaint  Patient presents with   Nail Problem    Pt stated that her nails are thick and discolored she stated that she has a callus on the side of her big toe     Discussed the use of AI scribe software for clinical note transcription with the patient, who gave verbal consent to proceed.  History of Present Illness Nicole Deleon is a 43 year old female who presents with thick toenails and foot swelling.  She reports progressive swelling of both feet over the past couple of years, with discomfort from rubbing in shoes even when she wears larger sizes. Swelling and discomfort have led her to stop professional nail and foot care.  She has painful, thickened toenails that have not improved with over-the-counter treatments. She has not had prior formal podiatric treatment.  She had knee surgery a couple of years ago. She currently smokes and drinks alcohol about twice a week. She has no medication allergies.      Objective:  There were no vitals filed for this visit.  Physical Exam General: AAO x3, NAD  Dermatological: Nails are hypertrophic, dystrophic yellow-brown discoloration and subungual debris is present.  There is no edema, erythema or open lesions.  Hyperkeratotic lesions on the hallux without any underlying ulceration.  Vascular: Dorsalis Pedis artery and Posterior Tibial artery pedal pulses are 2/4 bilateral with immedate capillary fill time. There is no pain with calf compression, swelling, warmth, erythema.   Neruologic: Grossly intact via light touch bilateral.   Musculoskeletal: No other areas of discomfort.     No images are attached to the encounter.    Results Procedure: Nail trimming and callus debridement Description: Nails were trimmed, and calluses on the medial and lateral aspects of the hallux were debrided.   Assessment:   1. Onychomycosis   2.  Callus      Plan:  Patient was evaluated and treated and all questions answered.  Assessment and Plan Assessment & Plan Onychomycosis Thickened toenails likely fungal. Over-the-counter treatments ineffective. Awaiting culture results for confirmation. - Trimmed toenails, sent for culture. - Discussed post-culture treatment options: oral antifungals, compounded topical treatments with urea. - Advised on oral antifungal risks: liver monitoring, alcohol avoidance. - Discussed cost and insurance for compounded topicals.  Corns and callosities Calluses on big toes due to shoe rubbing and toe positioning. Over-the-counter treatments ineffective. - Trimmed calluses to reduce pressure. - Advised use of moisturizer and pumice stone.    Return for nail culture results.   Nicole Deleon DPM

## 2023-12-26 ENCOUNTER — Ambulatory Visit: Admitting: Orthopedic Surgery

## 2023-12-26 ENCOUNTER — Other Ambulatory Visit: Payer: Self-pay | Admitting: Podiatry

## 2024-01-05 ENCOUNTER — Ambulatory Visit: Payer: Self-pay | Admitting: Podiatry

## 2024-01-21 ENCOUNTER — Ambulatory Visit: Admitting: Orthopedic Surgery
